# Patient Record
Sex: Male | Born: 1970 | Race: Black or African American | Hispanic: No | Marital: Married | Smoking: Former smoker
Health system: Southern US, Community
[De-identification: ages and names within clinical notes are randomized; demographics above are authoritative.]

## PROBLEM LIST (undated history)

## (undated) DIAGNOSIS — F329 Major depressive disorder, single episode, unspecified: Secondary | ICD-10-CM

## (undated) DIAGNOSIS — J45909 Unspecified asthma, uncomplicated: Secondary | ICD-10-CM

## (undated) DIAGNOSIS — F4310 Post-traumatic stress disorder, unspecified: Secondary | ICD-10-CM

## (undated) DIAGNOSIS — Z87828 Personal history of other (healed) physical injury and trauma: Secondary | ICD-10-CM

## (undated) DIAGNOSIS — J449 Chronic obstructive pulmonary disease, unspecified: Secondary | ICD-10-CM

## (undated) DIAGNOSIS — M1990 Unspecified osteoarthritis, unspecified site: Secondary | ICD-10-CM

## (undated) DIAGNOSIS — F32A Depression, unspecified: Secondary | ICD-10-CM

## (undated) HISTORY — DX: Unspecified asthma, uncomplicated: J45.909

## (undated) HISTORY — DX: Post-traumatic stress disorder, unspecified: F43.10

## (undated) HISTORY — DX: Personal history of other (healed) physical injury and trauma: Z87.828

---

## 1999-03-20 ENCOUNTER — Emergency Department (HOSPITAL_COMMUNITY): Admission: EM | Admit: 1999-03-20 | Discharge: 1999-03-20 | Payer: Self-pay | Admitting: Emergency Medicine

## 2000-09-18 ENCOUNTER — Encounter: Admission: RE | Admit: 2000-09-18 | Discharge: 2000-09-18 | Payer: Self-pay | Admitting: Cardiovascular Disease

## 2000-09-18 ENCOUNTER — Encounter: Payer: Self-pay | Admitting: Cardiovascular Disease

## 2003-07-16 ENCOUNTER — Emergency Department (HOSPITAL_COMMUNITY): Admission: EM | Admit: 2003-07-16 | Discharge: 2003-07-16 | Payer: Self-pay | Admitting: Emergency Medicine

## 2003-07-21 ENCOUNTER — Emergency Department (HOSPITAL_COMMUNITY): Admission: EM | Admit: 2003-07-21 | Discharge: 2003-07-21 | Payer: Self-pay | Admitting: Emergency Medicine

## 2011-02-08 ENCOUNTER — Emergency Department (HOSPITAL_COMMUNITY)
Admission: EM | Admit: 2011-02-08 | Discharge: 2011-02-08 | Disposition: A | Discharge status: Home / Self Care | Payer: BC Managed Care – PPO | Attending: Emergency Medicine | Admitting: Emergency Medicine

## 2011-02-08 DIAGNOSIS — J029 Acute pharyngitis, unspecified: Secondary | ICD-10-CM | POA: Insufficient documentation

## 2014-10-08 ENCOUNTER — Other Ambulatory Visit: Payer: Self-pay | Admitting: Orthopedic Surgery

## 2014-10-08 NOTE — Progress Notes (Signed)
Preoperative surgical orders have been place into the [facility] hospital system for Glenn Dean on 10/08/2014, 1:47 PM  by Patrica DuelPERKINS, ALEXZANDREW for surgery on 11/04/2014.  Preop Total Hip - Anterior Approach orders including Experel Injecion, IV Tylenol, and IV Decadron as long as there are no contraindications to the above medications. Avel Peacerew Perkins, PA-C

## 2014-10-26 NOTE — Patient Instructions (Addendum)
Glenn Dean  10/26/2014   Your procedure is scheduled on:  11/04/2014    Come thru the [facility] Entrance.    Follow the Signs to Short Stay Center at  1215pm  Call this number if you have problems the morning of surgery: [phone]   Remember:   Do not eat food after midnite.  May have clear liquids until 0730am then nothing by mouth.    Take these medicines the morning of surgery with A SIP OF WATER: Use albuterol Inhaler if needed and bring with you.    Do not wear jewelry,  Do not wear lotions, powders, or perfumes.deodorant.  . Men may shave face and neck.  Do not bring valuables to the hospital.  Contacts, dentures or bridgework may not be worn into surgery.  Leave suitcase in the car. After surgery it may be brought to your room.  For patients admitted to the hospital, checkout time is 11:00 AM the day of  discharge.        CLEAR LIQUID DIET   Foods Allowed                                                                     Foods Excluded  Coffee and tea, regular and decaf                             liquids that you cannot  Plain Jell-O in any flavor                                             see through such as: Fruit ices (not with fruit pulp)                                     milk, soups, orange juice  Iced Popsicles                                    All solid food Carbonated beverages, regular and diet                                    Cranberry, grape and apple juices Sports drinks like Gatorade Lightly seasoned clear broth or consume(fat free) Sugar, honey syrup  Sample Menu Breakfast                                Lunch                                     Supper Cranberry juice                    Beef broth  Chicken broth Jell-O                                     Grape juice                           Apple juice Coffee or tea                        Jell-O                                      Popsicle                                                 Coffee or tea                        Coffee or tea  _____________________________________________________________________  [facility] - Preparing for Surgery Before surgery, you can play an important role.  Because skin is not sterile, your skin needs to be as free of germs as possible.  You can reduce the number of germs on your skin by washing with CHG (chlorahexidine gluconate) soap before surgery.  CHG is an antiseptic cleaner which kills germs and bonds with the skin to continue killing germs even after washing. Please DO NOT use if you have an allergy to CHG or antibacterial soaps.  If your skin becomes reddened/irritated stop using the CHG and inform your nurse when you arrive at Short Stay. Do not shave (including legs and underarms) for at least 48 hours prior to the first CHG shower.  You may shave your face/neck. Please follow these instructions carefully:  1.  Shower with CHG Soap the night before surgery and the  morning of Surgery.  2.  If you choose to wash your hair, wash your hair first as usual with your  normal  shampoo.  3.  After you shampoo, rinse your hair and body thoroughly to remove the  shampoo.                           4.  Use CHG as you would any other liquid soap.  You can apply chg directly  to the skin and wash                       Gently with a scrungie or clean washcloth.  5.  Apply the CHG Soap to your body ONLY FROM THE NECK DOWN.   Do not use on face/ open                           Wound or open sores. Avoid contact with eyes, ears mouth and genitals (private parts).                       Wash face,  Genitals (private parts) with your normal soap.             6.  Wash thoroughly, paying special attention to the area  where your surgery  will be performed.  7.  Thoroughly rinse your body with warm water from the neck down.  8.  DO NOT shower/wash with your normal soap after using and rinsing off  the CHG Soap.                9.   Pat yourself dry with a clean towel.            10.  Wear clean pajamas.            11.  Place clean sheets on your bed the night of your first shower and do not  sleep with pets. Day of Surgery : Do not apply any lotions/deodorants the morning of surgery.  Please wear clean clothes to the hospital/surgery center.  FAILURE TO FOLLOW THESE INSTRUCTIONS MAY RESULT IN THE CANCELLATION OF YOUR SURGERY PATIENT SIGNATURE_________________________________  NURSE SIGNATURE__________________________________  ________________________________________________________________________  WHAT IS A BLOOD TRANSFUSION? Blood Transfusion Information  A transfusion is the replacement of blood or some of its parts. Blood is made up of multiple cells which provide different functions.  Red blood cells carry oxygen and are used for blood loss replacement.  White blood cells fight against infection.  Platelets control bleeding.  Plasma helps clot blood.  Other blood products are available for specialized needs, such as hemophilia or other clotting disorders. BEFORE THE TRANSFUSION  Who gives blood for transfusions?   Healthy volunteers who are fully evaluated to make sure their blood is safe. This is blood bank blood. Transfusion therapy is the safest it has ever been in the practice of medicine. Before blood is taken from a donor, a complete history is taken to make sure that person has no history of diseases nor engages in risky social behavior (examples are intravenous drug use or sexual activity with multiple partners). The donor's travel history is screened to minimize risk of transmitting infections, such as malaria. The donated blood is tested for signs of infectious diseases, such as HIV and hepatitis. The blood is then tested to be sure it is compatible with you in order to minimize the chance of a transfusion reaction. If you or a relative donates blood, this is often done in anticipation of surgery  and is not appropriate for emergency situations. It takes many days to process the donated blood. RISKS AND COMPLICATIONS Although transfusion therapy is very safe and saves many lives, the main dangers of transfusion include:  1. Getting an infectious disease. 2. Developing a transfusion reaction. This is an allergic reaction to something in the blood you were given. Every precaution is taken to prevent this. The decision to have a blood transfusion has been considered carefully by your caregiver before blood is given. Blood is not given unless the benefits outweigh the risks. AFTER THE TRANSFUSION  Right after receiving a blood transfusion, you will usually feel much better and more energetic. This is especially true if your red blood cells have gotten low (anemic). The transfusion raises the level of the red blood cells which carry oxygen, and this usually causes an energy increase.  The nurse administering the transfusion will monitor you carefully for complications. HOME CARE INSTRUCTIONS  No special instructions are needed after a transfusion. You may find your energy is better. Speak with your caregiver about any limitations on activity for underlying diseases you may have. SEEK MEDICAL CARE IF:   Your condition is not improving after your transfusion.  You develop redness or irritation at the intravenous (  IV) site. SEEK IMMEDIATE MEDICAL CARE IF:  Any of the following symptoms occur over the next 12 hours:  Shaking chills.  You have a temperature by mouth above 102 F (38.9 C), not controlled by medicine.  Chest, back, or muscle pain.  People around you feel you are not acting correctly or are confused.  Shortness of breath or difficulty breathing.  Dizziness and fainting.  You get a rash or develop hives.  You have a decrease in urine output.  Your urine turns a dark color or changes to pink, red, or brown. Any of the following symptoms occur over the next 10  days:  You have a temperature by mouth above 102 F (38.9 C), not controlled by medicine.  Shortness of breath.  Weakness after normal activity.  The white part of the eye turns yellow (jaundice).  You have a decrease in the amount of urine or are urinating less often.  Your urine turns a dark color or changes to pink, red, or brown. Document Released: 12/08/2000 Document Revised: 03/04/2012 Document Reviewed: 07/27/2008 Exit[place] Patient Information 2014 [place].  _______________________________________________________________________  Incentive Spirometer  An incentive spirometer is a tool that can help keep your lungs clear and active. This tool measures how well you are filling your lungs with each breath. Taking long deep breaths may help reverse or decrease the chance of developing breathing (pulmonary) problems (especially infection) following:  A long period of time when you are unable to move or be active. BEFORE THE PROCEDURE   If the spirometer includes an indicator to show your best effort, your nurse or respiratory therapist will set it to a desired goal.  If possible, sit up straight or lean slightly forward. Try not to slouch.  Hold the incentive spirometer in an upright position. INSTRUCTIONS FOR USE  3. Sit on the edge of your bed if possible, or sit up as far as you can in bed or on a chair. 4. Hold the incentive spirometer in an upright position. 5. Breathe out normally. 6. Place the mouthpiece in your mouth and seal your lips tightly around it. 7. Breathe in slowly and as deeply as possible, raising the piston or the ball toward the top of the column. 8. Hold your breath for 3-5 seconds or for as long as possible. Allow the piston or ball to fall to the bottom of the column. 9. Remove the mouthpiece from your mouth and breathe out normally. 10. Rest for a few seconds and repeat Steps 1 through 7 at least 10 times every 1-2 hours when you are awake.  Take your time and take a few normal breaths between deep breaths. 11. The spirometer may include an indicator to show your best effort. Use the indicator as a goal to work toward during each repetition. 12. After each set of 10 deep breaths, practice coughing to be sure your lungs are clear. If you have an incision (the cut made at the time of surgery), support your incision when coughing by placing a pillow or rolled up towels firmly against it. Once you are able to get out of bed, walk around indoors and cough well. You may stop using the incentive spirometer when instructed by your caregiver.  RISKS AND COMPLICATIONS  Take your time so you do not get dizzy or light-headed.  If you are in pain, you may need to take or ask for pain medication before doing incentive spirometry. It is harder to take a deep breath if you  are having pain. AFTER USE  Rest and breathe slowly and easily.  It can be helpful to keep track of a log of your progress. Your caregiver can provide you with a simple table to help with this. If you are using the spirometer at home, follow these instructions: [place] IF:   You are having difficultly using the spirometer.  You have trouble using the spirometer as often as instructed.  Your pain medication is not giving enough relief while using the spirometer.  You develop fever of 100.5 F (38.1 C) or higher. SEEK IMMEDIATE MEDICAL CARE IF:   You cough up bloody sputum that had not been present before.  You develop fever of 102 F (38.9 C) or greater.  You develop worsening pain at or near the incision site. MAKE SURE YOU:   Understand these instructions.  Will watch your condition.  Will get help right away if you are not doing well or get worse. Document Released: 04/23/2007 Document Revised: 03/04/2012 Document Reviewed: 06/24/2007 ExitCare Patient Information 2014 ExitCare,  [place].   ________________________________________________________________________    Please read over the following fact sheets that you were given: MRSA Information, coughing and deep breathing exercises, leg exercises

## 2014-10-27 ENCOUNTER — Encounter (HOSPITAL_COMMUNITY)
Admission: RE | Admit: 2014-10-27 | Discharge: 2014-10-27 | Disposition: A | Discharge status: Home / Self Care | Payer: BC Managed Care – PPO | Source: Ambulatory Visit | Attending: Orthopedic Surgery | Admitting: Orthopedic Surgery

## 2014-10-27 ENCOUNTER — Encounter (INDEPENDENT_AMBULATORY_CARE_PROVIDER_SITE_OTHER): Payer: Self-pay

## 2014-10-27 ENCOUNTER — Encounter (HOSPITAL_COMMUNITY): Payer: Self-pay

## 2014-10-27 ENCOUNTER — Ambulatory Visit (HOSPITAL_COMMUNITY)
Admission: RE | Admit: 2014-10-27 | Discharge: 2014-10-27 | Disposition: A | Discharge status: Home / Self Care | Payer: BC Managed Care – PPO | Source: Ambulatory Visit | Attending: Orthopedic Surgery | Admitting: Orthopedic Surgery

## 2014-10-27 DIAGNOSIS — Z01811 Encounter for preprocedural respiratory examination: Secondary | ICD-10-CM | POA: Diagnosis not present

## 2014-10-27 DIAGNOSIS — Z01812 Encounter for preprocedural laboratory examination: Secondary | ICD-10-CM | POA: Insufficient documentation

## 2014-10-27 HISTORY — DX: Major depressive disorder, single episode, unspecified: F32.9

## 2014-10-27 HISTORY — DX: Depression, unspecified: F32.A

## 2014-10-27 HISTORY — DX: Unspecified osteoarthritis, unspecified site: M19.90

## 2014-10-27 LAB — PROTIME-INR
INR: 1.05 (ref 0.00–1.49)
PROTHROMBIN TIME: 13.8 s (ref 11.6–15.2)

## 2014-10-27 LAB — URINALYSIS, ROUTINE W REFLEX MICROSCOPIC
Bilirubin Urine: NEGATIVE
Glucose, UA: NEGATIVE mg/dL
Hgb urine dipstick: NEGATIVE
KETONES UR: NEGATIVE mg/dL
Leukocytes, UA: NEGATIVE
NITRITE: NEGATIVE
Protein, ur: NEGATIVE mg/dL
Specific Gravity, Urine: 1.008 (ref 1.005–1.030)
UROBILINOGEN UA: 0.2 mg/dL (ref 0.0–1.0)
pH: 7 (ref 5.0–8.0)

## 2014-10-27 LAB — SURGICAL PCR SCREEN
MRSA, PCR: NEGATIVE
Staphylococcus aureus: NEGATIVE

## 2014-10-27 LAB — COMPREHENSIVE METABOLIC PANEL
ALT: 20 U/L (ref 0–53)
ANION GAP: 9 (ref 5–15)
AST: 18 U/L (ref 0–37)
Albumin: 3.9 g/dL (ref 3.5–5.2)
Alkaline Phosphatase: 55 U/L (ref 39–117)
BUN: 8 mg/dL (ref 6–23)
CALCIUM: 9.7 mg/dL (ref 8.4–10.5)
CO2: 29 meq/L (ref 19–32)
CREATININE: 1.02 mg/dL (ref 0.50–1.35)
Chloride: 103 mEq/L (ref 96–112)
GFR, EST NON AFRICAN AMERICAN: 87 mL/min — AB (ref >90)
GLUCOSE: 93 mg/dL (ref 70–99)
Potassium: 4.1 mEq/L (ref 3.7–5.3)
Sodium: 141 mEq/L (ref 137–147)
Total Bilirubin: 0.4 mg/dL (ref 0.3–1.2)
Total Protein: 7.9 g/dL (ref 6.0–8.3)

## 2014-10-27 LAB — CBC
HEMATOCRIT: 41.2 % (ref 39.0–52.0)
Hemoglobin: 14.2 g/dL (ref 13.0–17.0)
MCH: 31.8 pg (ref 26.0–34.0)
MCHC: 34.5 g/dL (ref 30.0–36.0)
MCV: 92.2 fL (ref 78.0–100.0)
Platelets: 219 10*3/uL (ref 150–400)
RBC: 4.47 MIL/uL (ref 4.22–5.81)
RDW: 12.9 % (ref 11.5–15.5)
WBC: 3.5 10*3/uL — ABNORMAL LOW (ref 4.0–10.5)

## 2014-10-27 LAB — APTT: aPTT: 29 seconds (ref 24–37)

## 2014-10-27 NOTE — Progress Notes (Signed)
Resent to patient email EMMI education

## 2014-11-03 ENCOUNTER — Ambulatory Visit: Payer: Self-pay | Admitting: Orthopedic Surgery

## 2014-11-03 NOTE — H&P (Signed)
Glenn GoodyHarold J. Dean DOB: 05/13/1971 Married / Language: [place] / Race: Black or African American, White Male Date of Admission:  11/04/2014 Chief Complaint:  Left Hip Pain History of Present Illness The patient is a 43 year old male who comes in  for a preoperative History and Physical. The patient is scheduled for a left total hip arthroplasty (anterior approach) to be performed by Dr. Gus RankinFrank V. [facility]uisio[facility]. The patient is a 43 year old male who presents for follow up of their hip. The patient is being followed for their left hip pain and osteoarthritis. Symptoms reported today include: pain, aching, throbbing and pain with weightbearing. The patient feels that they are doing poorly and report their pain level to be 5 / 10. Current treatment includes: NSAIDs (Etodolac). The following medication has been used for pain control: antiinflammatory medication. Glenn Dean has had problems with his hip for many years now. It is getting progressively worse over time. It is hurting him all the time. It is affecting his ability to do things and is affecting his ability to work. The pain bothers him at night and keeps him awake. He at this point is not getting better even with an injection. The most predictable means of improving his pain and function is going to be a total hip arthroplasty. We did discuss that in detail. We will set him up for surgical treatment. He is an excellent candidate for an anterior approach. They have been treated conservatively in the past for the above stated problem and despite conservative measures, they continue to have progressive pain and severe functional limitations and dysfunction. They have failed non-operative management including home exercise, medications, and injections. It is felt that they would benefit from undergoing total joint replacement. Risks and benefits of the procedure have been discussed with the patient and they elect to proceed  with surgery. There are no active contraindications to surgery such as ongoing infection or rapidly progressive neurological disease.  Problem List/Past Medical Primary osteoarthritis of left hip (M16.12) Depression  Allergies Tylenol with Codeine #3 *ANALGESICS - OPIOID* Hydrocodone-Acetaminophen *ANALGESICS - OPIOID*  Family History  Depression Mother. Diabetes Mellitus Father, Maternal Grandmother, Mother. Congestive Heart Failure Maternal Grandmother. Cerebrovascular Accident Maternal Grandmother.  Social History  Number of flights of stairs before winded 2-3 Tobacco / smoke exposure 04/22/2014: yes Not under pain contract Marital status married No history of drug/alcohol rehab Tobacco use Current every day smoker. 04/22/2014: smoke(d) less than 1/2 pack(s) per day Exercise Exercises rarely; does gym / weights Living situation live with spouse Current work status working full time Children 3 Current drinker 04/22/2014: Currently drinks beer less than 5 times per week  Medication History Etodolac (Oral) Specific dose unknown - Active. Sertraline HCl (Oral) Specific dose unknown - Active.  Past Surgical History No pertinent past surgical history  Review of Systems General Not Present- Chills, Fatigue, Fever, Memory Loss, Night Sweats, Weight Gain and Weight Loss. Skin Not Present- Eczema, Hives, Itching, Lesions and Rash. HEENT Not Present- Dentures, Double Vision, Headache, Hearing Loss, Tinnitus and Visual Loss. Respiratory Not Present- Allergies, Chronic Cough, Coughing up blood, Shortness of breath at rest and Shortness of breath with exertion. Cardiovascular Not Present- Chest Pain, Difficulty Breathing Lying Down, Murmur, Palpitations, Racing/skipping heartbeats and Swelling. Gastrointestinal Not Present- Abdominal Pain, Bloody Stool, Constipation, Diarrhea, Difficulty Swallowing, Heartburn, Jaundice, Loss of appetitie, Nausea and Vomiting. Male  Genitourinary Not Present- Blood in Urine, Discharge, Flank Pain, Incontinence, Painful Urination, Urgency, Urinary  frequency, Urinary Retention, Urinating at Night and Weak urinary stream. Musculoskeletal Present- Joint Pain (left hip pain). Not Present- Back Pain, Joint Swelling, Morning Stiffness, Muscle Pain, Muscle Weakness and Spasms. Neurological Not Present- Blackout spells, Difficulty with balance, Dizziness, Paralysis, Tremor and Weakness. Psychiatric Not Present- Insomnia.  Vitals  Weight: 145 lb Height: 69in Weight was reported by patient. Height was reported by patient. Body Surface Area: 1.79 m Body Mass Index: 21.41 kg/m  Pulse: 68 (Regular)  BP: 108/68 (Sitting, Left Arm, Standard)   Physical Exam General Mental Status -Alert, cooperative and good historian. General Appearance-pleasant, Not in acute distress. Orientation-Oriented X3. Build & Nutrition-Lean, Well nourished and Well developed.  Head and Neck Head-normocephalic, atraumatic . Neck Global Assessment - supple, no bruit auscultated on the right, no bruit auscultated on the left.  Eye Pupil - Bilateral-Regular and Round. Motion - Bilateral-EOMI.  Chest and Lung Exam Auscultation Breath sounds - clear at anterior chest wall and clear at posterior chest wall. Adventitious sounds - No Adventitious sounds.  Cardiovascular Auscultation Rhythm - Regular rate and rhythm. Heart Sounds - S1 WNL and S2 WNL. Murmurs & Other Heart Sounds - Auscultation of the heart reveals - No Murmurs.  Abdomen Palpation/Percussion Tenderness - Abdomen is non-tender to palpation. Rigidity (guarding) - Abdomen is soft. Auscultation Auscultation of the abdomen reveals - Bowel sounds normal.  Male Genitourinary Note: Not done, not pertinent to present illness   Musculoskeletal Note: He is alert and oriented. No apparent distress. The right hip has a normal range of motion and no discomfort. Left  hip flexion to 100, no internal rotation. There is about 10 external rotation and 10 abduction.  RADIOGRAPHS: His radiographs are reviewed. AP of the pelvis and lateral of the left hip shows bone on bone arthritis in the left hip.   Assessment & Plan  Primary osteoarthritis of left hip (M16.12) Note:Plan is for a Left Total Hip Replacement - anterior approach by Dr. Aluisio.  Plan is to go home with wife and family.  PCP - Dr. Samuel Kelley  The patient does not have any contraindications and will receive TXA (tranexamic acid) prior to surgery.  Signed electronically by Christabell Loseke L Ranveer Wahlstrom, III PA-C 

## 2014-11-04 ENCOUNTER — Inpatient Hospital Stay (HOSPITAL_COMMUNITY): Payer: BC Managed Care – PPO

## 2014-11-04 ENCOUNTER — Inpatient Hospital Stay (HOSPITAL_COMMUNITY)
Admission: RE | Admit: 2014-11-04 | Discharge: 2014-11-06 | DRG: 470 | Disposition: A | Discharge status: Home Health | Payer: BC Managed Care – PPO | Source: Ambulatory Visit | Attending: Orthopedic Surgery | Admitting: Orthopedic Surgery

## 2014-11-04 ENCOUNTER — Encounter (HOSPITAL_COMMUNITY): Payer: Self-pay | Admitting: *Deleted

## 2014-11-04 ENCOUNTER — Inpatient Hospital Stay (HOSPITAL_COMMUNITY): Payer: BC Managed Care – PPO | Admitting: Certified Registered Nurse Anesthetist

## 2014-11-04 ENCOUNTER — Encounter (HOSPITAL_COMMUNITY)
Admission: RE | Disposition: A | Discharge status: Home Health | Payer: Self-pay | Source: Ambulatory Visit | Attending: Orthopedic Surgery

## 2014-11-04 DIAGNOSIS — F329 Major depressive disorder, single episode, unspecified: Secondary | ICD-10-CM | POA: Diagnosis present

## 2014-11-04 DIAGNOSIS — Z96649 Presence of unspecified artificial hip joint: Secondary | ICD-10-CM

## 2014-11-04 DIAGNOSIS — F17210 Nicotine dependence, cigarettes, uncomplicated: Secondary | ICD-10-CM | POA: Diagnosis present

## 2014-11-04 DIAGNOSIS — M1612 Unilateral primary osteoarthritis, left hip: Principal | ICD-10-CM | POA: Diagnosis present

## 2014-11-04 DIAGNOSIS — M25552 Pain in left hip: Secondary | ICD-10-CM | POA: Diagnosis present

## 2014-11-04 HISTORY — PX: TOTAL HIP ARTHROPLASTY: SHX124

## 2014-11-04 LAB — TYPE AND SCREEN
ABO/RH(D): O POS
Antibody Screen: NEGATIVE

## 2014-11-04 LAB — ABO/RH: ABO/RH(D): O POS

## 2014-11-04 SURGERY — ARTHROPLASTY, HIP, TOTAL, ANTERIOR APPROACH
Anesthesia: General | Site: Hip | Laterality: Left

## 2014-11-04 MED ORDER — PROPOFOL 10 MG/ML IV BOLUS
INTRAVENOUS | Status: AC
  Filled 2014-11-04: qty 20

## 2014-11-04 MED ORDER — ACETAMINOPHEN 325 MG PO TABS: 650.0000 mg | ORAL_TABLET | Freq: Four times a day (QID) | ORAL | Status: DC | PRN

## 2014-11-04 MED ORDER — ALBUTEROL SULFATE (2.5 MG/3ML) 0.083% IN NEBU: 2.5000 mg | INHALATION_SOLUTION | Freq: Four times a day (QID) | RESPIRATORY_TRACT | Status: DC | PRN

## 2014-11-04 MED ORDER — SODIUM CHLORIDE 0.9 % IJ SOLN
INTRAMUSCULAR | Status: AC
  Filled 2014-11-04: qty 50

## 2014-11-04 MED ORDER — DEXAMETHASONE SODIUM PHOSPHATE 10 MG/ML IJ SOLN
10.0000 mg | Freq: Once | INTRAMUSCULAR | Status: DC
  Filled 2014-11-04: qty 1

## 2014-11-04 MED ORDER — HYDROMORPHONE HCL 2 MG PO TABS
2.0000 mg | ORAL_TABLET | ORAL | Status: DC | PRN
  Administered 2014-11-04: 2 mg via ORAL
  Administered 2014-11-05 – 2014-11-06 (×6): 4 mg via ORAL
  Filled 2014-11-04 (×4): qty 2
  Filled 2014-11-04: qty 1
  Filled 2014-11-04 (×2): qty 2

## 2014-11-04 MED ORDER — ACETAMINOPHEN 500 MG PO TABS
1000.0000 mg | ORAL_TABLET | Freq: Four times a day (QID) | ORAL | Status: AC
Start: 2014-11-04 — End: 2014-11-05
  Administered 2014-11-04 – 2014-11-05 (×2): 1000 mg via ORAL
  Filled 2014-11-04 (×3): qty 2

## 2014-11-04 MED ORDER — GLYCOPYRROLATE 0.2 MG/ML IJ SOLN
INTRAMUSCULAR | Status: AC
  Filled 2014-11-04: qty 2

## 2014-11-04 MED ORDER — HYDROMORPHONE HCL 1 MG/ML IJ SOLN
0.5000 mg | INTRAMUSCULAR | Status: DC | PRN
  Administered 2014-11-05: 1 mg via INTRAVENOUS
  Filled 2014-11-04: qty 1

## 2014-11-04 MED ORDER — CEFAZOLIN SODIUM-DEXTROSE 2-3 GM-% IV SOLR
2.0000 g | Freq: Four times a day (QID) | INTRAVENOUS | Status: AC
  Administered 2014-11-04 – 2014-11-05 (×2): 2 g via INTRAVENOUS
  Filled 2014-11-04 (×2): qty 50

## 2014-11-04 MED ORDER — RIVAROXABAN 10 MG PO TABS
10.0000 mg | ORAL_TABLET | Freq: Every day | ORAL | Status: DC
  Administered 2014-11-05 – 2014-11-06 (×2): 10 mg via ORAL
  Filled 2014-11-04 (×3): qty 1

## 2014-11-04 MED ORDER — ONDANSETRON HCL 4 MG/2ML IJ SOLN
INTRAMUSCULAR | Status: AC
  Filled 2014-11-04: qty 2

## 2014-11-04 MED ORDER — MIDAZOLAM HCL 5 MG/5ML IJ SOLN
INTRAMUSCULAR | Status: DC | PRN
Start: 2014-11-04 — End: 2014-11-04
  Administered 2014-11-04: 2 mg via INTRAVENOUS

## 2014-11-04 MED ORDER — METOCLOPRAMIDE HCL 10 MG PO TABS: 5.0000 mg | ORAL_TABLET | Freq: Three times a day (TID) | ORAL | Status: DC | PRN

## 2014-11-04 MED ORDER — LACTATED RINGERS IV SOLN: INTRAVENOUS | Status: DC

## 2014-11-04 MED ORDER — METHOCARBAMOL 500 MG PO TABS
500.0000 mg | ORAL_TABLET | Freq: Four times a day (QID) | ORAL | Status: DC | PRN
  Administered 2014-11-05 – 2014-11-06 (×3): 500 mg via ORAL
  Filled 2014-11-04 (×3): qty 1

## 2014-11-04 MED ORDER — ONDANSETRON HCL 4 MG/2ML IJ SOLN: 4.0000 mg | Freq: Four times a day (QID) | INTRAMUSCULAR | Status: DC | PRN

## 2014-11-04 MED ORDER — ALBUTEROL SULFATE HFA 108 (90 BASE) MCG/ACT IN AERS
INHALATION_SPRAY | RESPIRATORY_TRACT | Status: DC | PRN
  Administered 2014-11-04: 5 via RESPIRATORY_TRACT

## 2014-11-04 MED ORDER — PHENYLEPHRINE 40 MCG/ML (10ML) SYRINGE FOR IV PUSH (FOR BLOOD PRESSURE SUPPORT)
PREFILLED_SYRINGE | INTRAVENOUS | Status: AC
  Filled 2014-11-04: qty 10

## 2014-11-04 MED ORDER — GLYCOPYRROLATE 0.2 MG/ML IJ SOLN
INTRAMUSCULAR | Status: DC | PRN
  Administered 2014-11-04: .5 mg via INTRAVENOUS

## 2014-11-04 MED ORDER — DEXAMETHASONE SODIUM PHOSPHATE 10 MG/ML IJ SOLN
INTRAMUSCULAR | Status: AC
  Filled 2014-11-04: qty 1

## 2014-11-04 MED ORDER — POLYETHYLENE GLYCOL 3350 17 G PO PACK: 17.0000 g | PACK | Freq: Every day | ORAL | Status: DC | PRN

## 2014-11-04 MED ORDER — ROCURONIUM BROMIDE 100 MG/10ML IV SOLN
INTRAVENOUS | Status: AC
  Filled 2014-11-04: qty 1

## 2014-11-04 MED ORDER — FENTANYL CITRATE 0.05 MG/ML IJ SOLN
INTRAMUSCULAR | Status: AC
  Filled 2014-11-04: qty 2

## 2014-11-04 MED ORDER — ROCURONIUM BROMIDE 100 MG/10ML IV SOLN
INTRAVENOUS | Status: DC | PRN
  Administered 2014-11-04: 50 mg via INTRAVENOUS

## 2014-11-04 MED ORDER — BISACODYL 10 MG RE SUPP: 10.0000 mg | Freq: Every day | RECTAL | Status: DC | PRN

## 2014-11-04 MED ORDER — BUPIVACAINE HCL (PF) 0.25 % IJ SOLN
INTRAMUSCULAR | Status: DC | PRN
  Administered 2014-11-04: 20 mL

## 2014-11-04 MED ORDER — CEFAZOLIN SODIUM-DEXTROSE 2-3 GM-% IV SOLR
2.0000 g | INTRAVENOUS | Status: AC
  Administered 2014-11-04: 2 g via INTRAVENOUS

## 2014-11-04 MED ORDER — HYDROMORPHONE HCL 1 MG/ML IJ SOLN
INTRAMUSCULAR | Status: AC
  Filled 2014-11-04: qty 1

## 2014-11-04 MED ORDER — LIDOCAINE HCL (CARDIAC) 20 MG/ML IV SOLN
INTRAVENOUS | Status: AC
  Filled 2014-11-04: qty 5

## 2014-11-04 MED ORDER — HYDROMORPHONE HCL 1 MG/ML IJ SOLN
0.2500 mg | INTRAMUSCULAR | Status: DC | PRN
  Administered 2014-11-04 (×4): 0.5 mg via INTRAVENOUS

## 2014-11-04 MED ORDER — DOCUSATE SODIUM 100 MG PO CAPS
100.0000 mg | ORAL_CAPSULE | Freq: Two times a day (BID) | ORAL | Status: DC
  Administered 2014-11-04 – 2014-11-06 (×4): 100 mg via ORAL

## 2014-11-04 MED ORDER — LACTATED RINGERS IV SOLN
INTRAVENOUS | Status: DC
  Administered 2014-11-04: 1000 mL via INTRAVENOUS

## 2014-11-04 MED ORDER — SODIUM CHLORIDE 0.9 % IV SOLN
1000.0000 mg | INTRAVENOUS | Status: AC
  Administered 2014-11-04: 1000 mg via INTRAVENOUS
  Filled 2014-11-04: qty 10

## 2014-11-04 MED ORDER — NEOSTIGMINE METHYLSULFATE 10 MG/10ML IV SOLN
INTRAVENOUS | Status: DC | PRN
Start: 2014-11-04 — End: 2014-11-04
  Administered 2014-11-04: 3.5 mg via INTRAVENOUS

## 2014-11-04 MED ORDER — NEOSTIGMINE METHYLSULFATE 10 MG/10ML IV SOLN
INTRAVENOUS | Status: AC
  Filled 2014-11-04: qty 1

## 2014-11-04 MED ORDER — ACETAMINOPHEN 650 MG RE SUPP: 650.0000 mg | Freq: Four times a day (QID) | RECTAL | Status: DC | PRN

## 2014-11-04 MED ORDER — DEXAMETHASONE SODIUM PHOSPHATE 10 MG/ML IJ SOLN
INTRAMUSCULAR | Status: DC | PRN
  Administered 2014-11-04: 10 mg via INTRAVENOUS

## 2014-11-04 MED ORDER — FLEET ENEMA 7-19 GM/118ML RE ENEM: 1.0000 | ENEMA | Freq: Once | RECTAL | Status: AC | PRN

## 2014-11-04 MED ORDER — BUPIVACAINE LIPOSOME 1.3 % IJ SUSP
20.0000 mL | Freq: Once | INTRAMUSCULAR | Status: DC
  Filled 2014-11-04: qty 20

## 2014-11-04 MED ORDER — FENTANYL CITRATE 0.05 MG/ML IJ SOLN
INTRAMUSCULAR | Status: AC
  Filled 2014-11-04: qty 5

## 2014-11-04 MED ORDER — METOCLOPRAMIDE HCL 5 MG/ML IJ SOLN
5.0000 mg | Freq: Three times a day (TID) | INTRAMUSCULAR | Status: DC | PRN
  Administered 2014-11-05: 10 mg via INTRAVENOUS
  Filled 2014-11-04: qty 2

## 2014-11-04 MED ORDER — ONDANSETRON HCL 4 MG PO TABS: 4.0000 mg | ORAL_TABLET | Freq: Four times a day (QID) | ORAL | Status: DC | PRN

## 2014-11-04 MED ORDER — PROPOFOL 10 MG/ML IV BOLUS
INTRAVENOUS | Status: DC | PRN
  Administered 2014-11-04: 200 mg via INTRAVENOUS

## 2014-11-04 MED ORDER — ACETAMINOPHEN 10 MG/ML IV SOLN
1000.0000 mg | Freq: Once | INTRAVENOUS | Status: AC
  Administered 2014-11-04: 1000 mg via INTRAVENOUS
  Filled 2014-11-04: qty 100

## 2014-11-04 MED ORDER — CEFAZOLIN SODIUM-DEXTROSE 2-3 GM-% IV SOLR
INTRAVENOUS | Status: AC
  Filled 2014-11-04: qty 50

## 2014-11-04 MED ORDER — EPHEDRINE SULFATE 50 MG/ML IJ SOLN
INTRAMUSCULAR | Status: DC | PRN
  Administered 2014-11-04: 5 mg via INTRAVENOUS

## 2014-11-04 MED ORDER — HYDROMORPHONE HCL 1 MG/ML IJ SOLN
INTRAMUSCULAR | Status: DC | PRN
  Administered 2014-11-04: 0.5 mg via INTRAVENOUS
  Administered 2014-11-04: 1 mg via INTRAVENOUS
  Administered 2014-11-04: 0.5 mg via INTRAVENOUS

## 2014-11-04 MED ORDER — FENTANYL CITRATE 0.05 MG/ML IJ SOLN
INTRAMUSCULAR | Status: DC | PRN
  Administered 2014-11-04: 100 ug via INTRAVENOUS
  Administered 2014-11-04 (×5): 50 ug via INTRAVENOUS

## 2014-11-04 MED ORDER — GLYCOPYRROLATE 0.2 MG/ML IJ SOLN
INTRAMUSCULAR | Status: AC
  Filled 2014-11-04: qty 3

## 2014-11-04 MED ORDER — BUPIVACAINE HCL (PF) 0.25 % IJ SOLN
INTRAMUSCULAR | Status: AC
  Filled 2014-11-04: qty 30

## 2014-11-04 MED ORDER — SODIUM CHLORIDE 0.9 % IV SOLN: INTRAVENOUS | Status: DC

## 2014-11-04 MED ORDER — CHLORHEXIDINE GLUCONATE 4 % EX LIQD: 60.0000 mL | Freq: Once | CUTANEOUS | Status: DC

## 2014-11-04 MED ORDER — SODIUM CHLORIDE 0.9 % IJ SOLN
INTRAMUSCULAR | Status: DC | PRN
  Administered 2014-11-04: 30 mL

## 2014-11-04 MED ORDER — DEXTROSE 5 % IV SOLN
500.0000 mg | Freq: Four times a day (QID) | INTRAVENOUS | Status: DC | PRN
  Administered 2014-11-04: 500 mg via INTRAVENOUS
  Filled 2014-11-04 (×2): qty 5

## 2014-11-04 MED ORDER — ALBUTEROL SULFATE HFA 108 (90 BASE) MCG/ACT IN AERS
INHALATION_SPRAY | RESPIRATORY_TRACT | Status: AC
  Filled 2014-11-04: qty 6.7

## 2014-11-04 MED ORDER — ONDANSETRON HCL 4 MG/2ML IJ SOLN
INTRAMUSCULAR | Status: DC | PRN
Start: 2014-11-04 — End: 2014-11-04
  Administered 2014-11-04: 4 mg via INTRAVENOUS

## 2014-11-04 MED ORDER — DEXAMETHASONE SODIUM PHOSPHATE 10 MG/ML IJ SOLN: 10.0000 mg | Freq: Once | INTRAMUSCULAR | Status: DC

## 2014-11-04 MED ORDER — BUPIVACAINE LIPOSOME 1.3 % IJ SUSP
INTRAMUSCULAR | Status: DC | PRN
  Administered 2014-11-04: 20 mL

## 2014-11-04 MED ORDER — PHENYLEPHRINE HCL 10 MG/ML IJ SOLN
INTRAMUSCULAR | Status: DC | PRN
  Administered 2014-11-04: 40 ug via INTRAVENOUS
  Administered 2014-11-04: 80 ug via INTRAVENOUS
  Administered 2014-11-04: 40 ug via INTRAVENOUS

## 2014-11-04 MED ORDER — DIPHENHYDRAMINE HCL 12.5 MG/5ML PO ELIX: 12.5000 mg | ORAL_SOLUTION | ORAL | Status: DC | PRN

## 2014-11-04 MED ORDER — SUCCINYLCHOLINE CHLORIDE 20 MG/ML IJ SOLN
INTRAMUSCULAR | Status: DC | PRN
  Administered 2014-11-04: 100 mg via INTRAVENOUS

## 2014-11-04 MED ORDER — 0.9 % SODIUM CHLORIDE (POUR BTL) OPTIME
TOPICAL | Status: DC | PRN
  Administered 2014-11-04: 1000 mL

## 2014-11-04 MED ORDER — HYDROMORPHONE HCL 2 MG/ML IJ SOLN
INTRAMUSCULAR | Status: AC
  Filled 2014-11-04: qty 1

## 2014-11-04 MED ORDER — PHENOL 1.4 % MT LIQD: 1.0000 | OROMUCOSAL | Status: DC | PRN

## 2014-11-04 MED ORDER — SERTRALINE HCL 50 MG PO TABS
50.0000 mg | ORAL_TABLET | Freq: Every day | ORAL | Status: DC
  Administered 2014-11-04 – 2014-11-05 (×2): 50 mg via ORAL
  Filled 2014-11-04 (×3): qty 1

## 2014-11-04 MED ORDER — MIDAZOLAM HCL 2 MG/2ML IJ SOLN
INTRAMUSCULAR | Status: AC
  Filled 2014-11-04: qty 2

## 2014-11-04 MED ORDER — LIDOCAINE HCL (CARDIAC) 20 MG/ML IV SOLN
INTRAVENOUS | Status: DC | PRN
  Administered 2014-11-04: 80 mg via INTRAVENOUS

## 2014-11-04 MED ORDER — MENTHOL 3 MG MT LOZG
1.0000 | LOZENGE | OROMUCOSAL | Status: DC | PRN
  Filled 2014-11-04: qty 9

## 2014-11-04 SURGICAL SUPPLY — 39 items
BAG SPEC THK2 15X12 ZIP CLS (MISCELLANEOUS)
BAG ZIPLOCK 12X15 (MISCELLANEOUS) IMPLANT
BLADE EXTENDED COATED 6.5IN (ELECTRODE) ×2 IMPLANT
BLADE SAG 18X100X1.27 (BLADE) ×2 IMPLANT
CAPT HIP PF COP ×1 IMPLANT
COVER PERINEAL POST (MISCELLANEOUS) ×2 IMPLANT
DECANTER SPIKE VIAL GLASS SM (MISCELLANEOUS) ×2 IMPLANT
DRAPE C-ARM 42X120 X-RAY (DRAPES) ×2 IMPLANT
DRAPE STERI IOBAN 125X83 (DRAPES) ×2 IMPLANT
DRAPE U-SHAPE 47X51 STRL (DRAPES) ×6 IMPLANT
DRSG ADAPTIC 3X8 NADH LF (GAUZE/BANDAGES/DRESSINGS) ×2 IMPLANT
DRSG MEPILEX BORDER 4X4 (GAUZE/BANDAGES/DRESSINGS) ×2 IMPLANT
DRSG MEPILEX BORDER 4X8 (GAUZE/BANDAGES/DRESSINGS) ×2 IMPLANT
DURAPREP 26ML APPLICATOR (WOUND CARE) ×2 IMPLANT
ELECT REM PT RETURN 9FT ADLT (ELECTROSURGICAL) ×2
ELECTRODE REM PT RTRN 9FT ADLT (ELECTROSURGICAL) ×1 IMPLANT
EVACUATOR 1/8 PVC DRAIN (DRAIN) ×2 IMPLANT
FACESHIELD WRAPAROUND (MASK) ×8 IMPLANT
FACESHIELD WRAPAROUND OR TEAM (MASK) ×4 IMPLANT
GLOVE BIO SURGEON STRL SZ7.5 (GLOVE) ×2 IMPLANT
GLOVE BIO SURGEON STRL SZ8 (GLOVE) ×4 IMPLANT
GLOVE BIOGEL PI IND STRL 8 (GLOVE) ×2 IMPLANT
GLOVE BIOGEL PI INDICATOR 8 (GLOVE) ×2
GOWN STRL REUS W/TWL LRG LVL3 (GOWN DISPOSABLE) ×2 IMPLANT
GOWN STRL REUS W/TWL XL LVL3 (GOWN DISPOSABLE) ×2 IMPLANT
KIT BASIN OR (CUSTOM PROCEDURE TRAY) ×2 IMPLANT
NDL SAFETY ECLIPSE 18X1.5 (NEEDLE) ×2 IMPLANT
NEEDLE HYPO 18GX1.5 SHARP (NEEDLE) ×4
PACK TOTAL JOINT (CUSTOM PROCEDURE TRAY) ×2 IMPLANT
STRIP CLOSURE SKIN 1/2X4 (GAUZE/BANDAGES/DRESSINGS) ×2 IMPLANT
SUT ETHIBOND NAB CT1 #1 30IN (SUTURE) ×2 IMPLANT
SUT MNCRL AB 4-0 PS2 18 (SUTURE) ×2 IMPLANT
SUT VIC AB 2-0 CT1 27 (SUTURE) ×4
SUT VIC AB 2-0 CT1 TAPERPNT 27 (SUTURE) ×2 IMPLANT
SUT VLOC 180 0 24IN GS25 (SUTURE) ×2 IMPLANT
SYR 20CC LL (SYRINGE) ×2 IMPLANT
SYR 50ML LL SCALE MARK (SYRINGE) ×2 IMPLANT
TOWEL OR 17X26 10 PK STRL BLUE (TOWEL DISPOSABLE) ×2 IMPLANT
TRAY FOLEY CATH 14FRSI W/METER (CATHETERS) ×2 IMPLANT

## 2014-11-04 NOTE — Transfer of Care (Signed)
Immediate Anesthesia Transfer of Care Note  Patient: Glenn GoodyHarold J Tworek  Procedure(s) Performed: Procedure(s): LEFT TOTAL HIP ARTHROPLASTY ANTERIOR APPROACH (Left)  Patient Location: PACU  Anesthesia Type:General  Level of Consciousness: awake, alert , oriented and patient cooperative  Airway & Oxygen Therapy: Patient Spontanous Breathing and Patient connected to face mask oxygen  Post-op Assessment: Report given to PACU RN, Post -op Vital signs reviewed and stable and Patient moving all extremities  Post vital signs: Reviewed and stable  Complications: No apparent anesthesia complications

## 2014-11-04 NOTE — Plan of Care (Signed)
Problem: Phase I Progression Outcomes Goal: Hemodynamically stable Outcome: Completed/Met Date Met:  11/04/14

## 2014-11-04 NOTE — Plan of Care (Signed)
Problem: Phase I Progression Outcomes Goal: Pain controlled with appropriate interventions Outcome: Completed/Met Date Met:  11/04/14     

## 2014-11-04 NOTE — Plan of Care (Signed)
Problem: Phase I Progression Outcomes Goal: CMS/Neurovascular status WDL Outcome: Completed/Met Date Met:  11/04/14

## 2014-11-04 NOTE — H&P (View-Only) (Signed)
Glenn GoodyHarold J. Dean DOB: 05/13/1971 Married / Language: [place] / Race: Black or African American, White Male Date of Admission:  11/04/2014 Chief Complaint:  Left Hip Pain History of Present Illness The patient is a 43 year old male who comes in  for a preoperative History and Physical. The patient is scheduled for a left total hip arthroplasty (anterior approach) to be performed by Dr. Gus RankinFrank V. Aluisio, [facility] 11/04/2014. The patient is a 43 year old male who presents for follow up of their hip. The patient is being followed for their left hip pain and osteoarthritis. Symptoms reported today include: pain, aching, throbbing and pain with weightbearing. The patient feels that they are doing poorly and report their pain level to be 5 / 10. Current treatment includes: NSAIDs (Etodolac). The following medication has been used for pain control: antiinflammatory medication. Glenn SharkHarold has had problems with his hip for many years now. It is getting progressively worse over time. It is hurting him all the time. It is affecting his ability to do things and is affecting his ability to work. The pain bothers him at night and keeps him awake. He at this point is not getting better even with an injection. The most predictable means of improving his pain and function is going to be a total hip arthroplasty. We did discuss that in detail. We will set him up for surgical treatment. He is an excellent candidate for an anterior approach. They have been treated conservatively in the past for the above stated problem and despite conservative measures, they continue to have progressive pain and severe functional limitations and dysfunction. They have failed non-operative management including home exercise, medications, and injections. It is felt that they would benefit from undergoing total joint replacement. Risks and benefits of the procedure have been discussed with the patient and they elect to proceed  with surgery. There are no active contraindications to surgery such as ongoing infection or rapidly progressive neurological disease.  Problem List/Past Medical Primary osteoarthritis of left hip (M16.12) Depression  Allergies Tylenol with Codeine #3 *ANALGESICS - OPIOID* Hydrocodone-Acetaminophen *ANALGESICS - OPIOID*  Family History  Depression Mother. Diabetes Mellitus Father, Maternal Grandmother, Mother. Congestive Heart Failure Maternal Grandmother. Cerebrovascular Accident Maternal Grandmother.  Social History  Number of flights of stairs before winded 2-3 Tobacco / smoke exposure 04/22/2014: yes Not under pain contract Marital status married No history of drug/alcohol rehab Tobacco use Current every day smoker. 04/22/2014: smoke(d) less than 1/2 pack(s) per day Exercise Exercises rarely; does gym / weights Living situation live with spouse Current work status working full time Children 3 Current drinker 04/22/2014: Currently drinks beer less than 5 times per week  Medication History Etodolac (Oral) Specific dose unknown - Active. Sertraline HCl (Oral) Specific dose unknown - Active.  Past Surgical History No pertinent past surgical history  Review of Systems General Not Present- Chills, Fatigue, Fever, Memory Loss, Night Sweats, Weight Gain and Weight Loss. Skin Not Present- Eczema, Hives, Itching, Lesions and Rash. HEENT Not Present- Dentures, Double Vision, Headache, Hearing Loss, Tinnitus and Visual Loss. Respiratory Not Present- Allergies, Chronic Cough, Coughing up blood, Shortness of breath at rest and Shortness of breath with exertion. Cardiovascular Not Present- Chest Pain, Difficulty Breathing Lying Down, Murmur, Palpitations, Racing/skipping heartbeats and Swelling. Gastrointestinal Not Present- Abdominal Pain, Bloody Stool, Constipation, Diarrhea, Difficulty Swallowing, Heartburn, Jaundice, Loss of appetitie, Nausea and Vomiting. Male  Genitourinary Not Present- Blood in Urine, Discharge, Flank Pain, Incontinence, Painful Urination, Urgency, Urinary  frequency, Urinary Retention, Urinating at Night and Weak urinary stream. Musculoskeletal Present- Joint Pain (left hip pain). Not Present- Back Pain, Joint Swelling, Morning Stiffness, Muscle Pain, Muscle Weakness and Spasms. Neurological Not Present- Blackout spells, Difficulty with balance, Dizziness, Paralysis, Tremor and Weakness. Psychiatric Not Present- Insomnia.  Vitals  Weight: 145 lb Height: 69in Weight was reported by patient. Height was reported by patient. Body Surface Area: 1.79 m Body Mass Index: 21.41 kg/m  Pulse: 68 (Regular)  BP: 108/68 (Sitting, Left Arm, Standard)   Physical Exam General Mental Status -Alert, cooperative and good historian. General Appearance-pleasant, Not in acute distress. Orientation-Oriented X3. Build & Nutrition-Lean, Well nourished and Well developed.  Head and Neck Head-normocephalic, atraumatic . Neck Global Assessment - supple, no bruit auscultated on the right, no bruit auscultated on the left.  Eye Pupil - Bilateral-Regular and Round. Motion - Bilateral-EOMI.  Chest and Lung Exam Auscultation Breath sounds - clear at anterior chest wall and clear at posterior chest wall. Adventitious sounds - No Adventitious sounds.  Cardiovascular Auscultation Rhythm - Regular rate and rhythm. Heart Sounds - S1 WNL and S2 WNL. Murmurs & Other Heart Sounds - Auscultation of the heart reveals - No Murmurs.  Abdomen Palpation/Percussion Tenderness - Abdomen is non-tender to palpation. Rigidity (guarding) - Abdomen is soft. Auscultation Auscultation of the abdomen reveals - Bowel sounds normal.  Male Genitourinary Note: Not done, not pertinent to present illness   Musculoskeletal Note: He is alert and oriented. No apparent distress. The right hip has a normal range of motion and no discomfort. Left  hip flexion to 100, no internal rotation. There is about 10 external rotation and 10 abduction.  RADIOGRAPHS: His radiographs are reviewed. AP of the pelvis and lateral of the left hip shows bone on bone arthritis in the left hip.   Assessment & Plan  Primary osteoarthritis of left hip (M16.12) Note:Plan is for a Left Total Hip Replacement - anterior approach by Dr. Lequita HaltAluisio.  Plan is to go home with wife and family.  PCP - Dr. Rita OharaSamuel Kelley  The patient does not have any contraindications and will receive TXA (tranexamic acid) prior to surgery.  Signed electronically by Lauraine RinneAlexzandrew L Andi Layfield, III PA-C

## 2014-11-04 NOTE — Plan of Care (Signed)
Problem: Phase I Progression Outcomes Goal: Dangle or out of bed evening of surgery Outcome: Completed/Met Date Met:  11/04/14     

## 2014-11-04 NOTE — Interval H&P Note (Signed)
History and Physical Interval Note:  11/04/2014 1:41 PM  Glenn Dean  has presented today for surgery, with the diagnosis of OA LEFT HIP  The various methods of treatment have been discussed with the patient and family. After consideration of risks, benefits and other options for treatment, the patient has consented to  Procedure(s): LEFT TOTAL HIP ARTHROPLASTY ANTERIOR APPROACH (Left) as a surgical intervention .  The patient's history has been reviewed, patient examined, no change in status, stable for surgery.  I have reviewed the patient's chart and labs.  Questions were answered to the patient's satisfaction.     Loanne DrillingALUISIO,Radames Mejorado V

## 2014-11-04 NOTE — Anesthesia Preprocedure Evaluation (Addendum)
Anesthesia Evaluation  Patient identified by MRN, date of birth, ID band Patient awake    Reviewed: Allergy & Precautions, H&P , NPO status , Patient's Chart, lab work & pertinent test results  Airway Mallampati: II  TM Distance: >3 FB Neck ROM: full    Dental  (+) Dental Advisory Given, Caps Right upper front is repaired tooth from large break:   Pulmonary neg pulmonary ROS, Current Smoker,  breath sounds clear to auscultation  Pulmonary exam normal       Cardiovascular Exercise Tolerance: Good negative cardio ROS  Rhythm:regular Rate:Normal     Neuro/Psych negative neurological ROS  negative psych ROS   GI/Hepatic negative GI ROS, Neg liver ROS,   Endo/Other  negative endocrine ROS  Renal/GU negative Renal ROS  negative genitourinary   Musculoskeletal   Abdominal   Peds  Hematology negative hematology ROS (+)   Anesthesia Other Findings   Reproductive/Obstetrics negative OB ROS                           Anesthesia Physical Anesthesia Plan  ASA: II  Anesthesia Plan: General   Post-op Pain Management:    Induction: Intravenous  Airway Management Planned: Oral ETT  Additional Equipment:   Intra-op Plan:   Post-operative Plan: Extubation in OR  Informed Consent: I have reviewed the patients History and Physical, chart, labs and discussed the procedure including the risks, benefits and alternatives for the proposed anesthesia with the patient or authorized representative who has indicated his/her understanding and acceptance.   Dental Advisory Given  Plan Discussed with: CRNA and Surgeon  Anesthesia Plan Comments:        Anesthesia Quick Evaluation

## 2014-11-04 NOTE — Plan of Care (Signed)
Problem: Consults Goal: Total Joint Replacement Patient Education See Patient Education Module for education specifics. Outcome: Progressing Goal: Diagnosis- Total Joint Replacement Primary Total Hip Goal: Skin Care Protocol Initiated - if Braden Score 18 or less If consults are not indicated, leave blank or document N/A Outcome: Not Applicable Date Met:  49/35/52 Goal: Nutrition Consult-if indicated Outcome: Not Applicable Date Met:  17/47/15 Goal: Diabetes Guidelines if Diabetic/Glucose > 140 If diabetic or lab glucose is > 140 mg/dl - Initiate Diabetes/Hyperglycemia Guidelines & Document Interventions  Outcome: Not Applicable Date Met:  95/39/67  Problem: Phase I Progression Outcomes Goal: CMS/Neurovascular status WDL Outcome: Progressing Goal: Pain controlled with appropriate interventions Outcome: Progressing

## 2014-11-04 NOTE — Op Note (Signed)
OPERATIVE REPORT  PREOPERATIVE DIAGNOSIS: Osteoarthritis of the Left hip.   POSTOPERATIVE DIAGNOSIS: Osteoarthritis of the Left  hip.   PROCEDURE: Left total hip arthroplasty, anterior approach.   SURGEON: Ollen GrossFrank Tanika Bracco, MD   ASSISTANT: Avel Peacerew Perkins, PA-C  ANESTHESIA:  General  ESTIMATED BLOOD LOSS:-425 ml   DRAINS: Hemovac x1.   COMPLICATIONS: None   CONDITION: PACU - hemodynamically stable.   BRIEF CLINICAL NOTE: Glenn Dean is a 43 y.o. male who has advanced end-  stage arthritis of his Left  hip with progressively worsening pain and  dysfunction.The patient has failed nonoperative management and presents for  total hip arthroplasty.   PROCEDURE IN DETAIL: After successful administration of spinal  anesthetic, the traction boots [facility] were placed on both  feet and the [facility], boots placed into the leg  holders. The Left hip was then isolated from the perineum with plastic  drapes and prepped and draped in the usual sterile fashion. ASIS and  greater trochanter were marked and a oblique incision was made, starting  at about 1 cm lateral and 2 cm distal to the ASIS and coursing towards  the anterior cortex of the femur. The skin was cut with a 10 blade  through subcutaneous tissue to the level of the fascia overlying the  tensor fascia lata muscle. The fascia was then incised in line with the  incision at the junction of the anterior third and posterior 2/3rd. The  muscle was teased off the fascia and then the interval between the TFL  and the rectus was developed. The Hohmann retractor was then placed at  the top of the femoral neck over the capsule. The vessels overlying the  capsule were cauterized and the fat on top of the capsule was removed.  A Hohmann retractor was then placed anterior underneath the rectus  femoris to give exposure to the entire anterior capsule. A T-shaped  capsulotomy was performed. The  edges were tagged and the femoral head  was identified.       Osteophytes are removed off the superior acetabulum.  The femoral neck was then cut in situ with an oscillating saw. Traction  was then applied to the left lower extremity utilizing the [facility]  traction. The femoral head was then removed. Retractors were placed  around the acetabulum and then circumferential removal of the labrum was  performed. Osteophytes were also removed. Reaming starts at 45 mm to  medialize and  Increased in 2 mm increments to 51 mm. We reamed in  approximately 40 degrees of abduction, 20 degrees anteversion. A 52 mm  pinnacle acetabular shell was then impacted in anatomic position under  fluoroscopic guidance with excellent purchase. We did not need to place  any additional dome screws. A 32 mm Neutral + 4 marathon liner was then  placed into the acetabular shell.       The femoral lift was then placed along the lateral aspect of the femur  just distal to the vastus ridge. The leg was  externally rotated and capsule  was stripped off the inferior aspect of the femoral neck down to the  level of the lesser trochanter, this was done with electrocautery. The femur was lifted after this was performed. The  leg was then placed and extended in adducted position to essentially delivering the femur. We also removed the capsule superiorly and the  piriformis from the piriformis  fossa to gain excellent exposure of the  proximal femur. Rongeur was used to remove some cancellous bone to get  into the lateral portion of the proximal femur for placement of the  initial starter reamer. The starter broaches was placed  the starter broach  and was shown to go down the center of the canal. Broaching  with the  Corail system was then performed starting at size 8, coursing  Up to size 11. A size 11 had excellent torsional and rotational  and axial stability. The trial standard offset neck was then placed  with a 32 + 1 trial  head. The hip was then reduced. We confirmed that  the stem was in the canal both on AP and lateral x-rays. It also has excellent sizing. The hip was reduced with outstanding stability through full extension, full external rotation,  and then flexion in adduction internal rotation. AP pelvis was taken  and the leg lengths were measured and found to be exactly equal. Hip  was then dislocated again and the femoral head and neck removed. The  femoral broach was removed. Size 11 Corail stem with a standard offset  neck was then impacted into the femur following native anteversion. Has  excellent purchase in the canal. Excellent torsional and rotational and  axial stability. It is confirmed to be in the canal on AP and lateral  fluoroscopic views. The 32 + 1 ceramic head was placed and the hip  reduced with outstanding stability. Again AP pelvis was taken and it  confirmed that the leg lengths were equal. The wound was then copiously  irrigated with saline solution and the capsule reattached and repaired  with Ethibond suture.  20 mL of Exparel mixed with 50 mL of saline then additional 20 ml of .25% Bupivicaine injected into the capsule and into the edge of the tensor fascia lata as well as subcutaneous tissue. The fascia overlying the tensor fascia lata was  then closed with a running #1 V-Loc. Subcu was closed with interrupted  2-0 Vicryl and subcuticular running 4-0 Monocryl. Incision was cleaned  and dried. Steri-Strips and a bulky sterile dressing applied. Hemovac  drain was hooked to suction and then he was awakened and transported to  recovery in stable condition.        Please note that a surgical assistant was a medical necessity for this procedure to perform it in a safe and expeditious manner. Assistant was necessary to provide appropriate retraction of vital neurovascular structures and to prevent femoral fracture and allow for anatomic placement of the prosthesis.  Gaynelle Arabian, M.D.

## 2014-11-04 NOTE — Anesthesia Postprocedure Evaluation (Signed)
  Anesthesia Post-op Note  Patient: Glenn Dean  Procedure(s) Performed: Procedure(s) (LRB): LEFT TOTAL HIP ARTHROPLASTY ANTERIOR APPROACH (Left)  Patient Location: PACU  Anesthesia Type: General  Level of Consciousness: awake and alert   Airway and Oxygen Therapy: Patient Spontanous Breathing  Post-op Pain: mild  Post-op Assessment: Post-op Vital signs reviewed, Patient's Cardiovascular Status Stable, Respiratory Function Stable, Patent Airway and No signs of Nausea or vomiting  Last Vitals:  Filed Vitals:   11/04/14 1630  BP: 127/75  Pulse: 94  Temp:   Resp: 14    Post-op Vital Signs: stable   Complications: No apparent anesthesia complications

## 2014-11-05 ENCOUNTER — Encounter (HOSPITAL_COMMUNITY): Payer: Self-pay | Admitting: Orthopedic Surgery

## 2014-11-05 LAB — BASIC METABOLIC PANEL
ANION GAP: 10 (ref 5–15)
BUN: 10 mg/dL (ref 6–23)
CO2: 25 mEq/L (ref 19–32)
Calcium: 8.9 mg/dL (ref 8.4–10.5)
Chloride: 101 mEq/L (ref 96–112)
Creatinine, Ser: 1 mg/dL (ref 0.50–1.35)
GFR calc Af Amer: >90 mL/min (ref >90)
GFR calc non Af Amer: 89 mL/min — ABNORMAL LOW (ref >90)
Glucose, Bld: 157 mg/dL — ABNORMAL HIGH (ref 70–99)
Potassium: 4 mEq/L (ref 3.7–5.3)
Sodium: 136 mEq/L — ABNORMAL LOW (ref 137–147)

## 2014-11-05 LAB — CBC
HCT: 35 % — ABNORMAL LOW (ref 39.0–52.0)
Hemoglobin: 11.9 g/dL — ABNORMAL LOW (ref 13.0–17.0)
MCH: 31.6 pg (ref 26.0–34.0)
MCHC: 34 g/dL (ref 30.0–36.0)
MCV: 93.1 fL (ref 78.0–100.0)
PLATELETS: 192 10*3/uL (ref 150–400)
RBC: 3.76 MIL/uL — ABNORMAL LOW (ref 4.22–5.81)
RDW: 13 % (ref 11.5–15.5)
WBC: 9.7 10*3/uL (ref 4.0–10.5)

## 2014-11-05 MED ORDER — METHOCARBAMOL 500 MG PO TABS: 500.0000 mg | ORAL_TABLET | Freq: Four times a day (QID) | ORAL | Status: DC | PRN

## 2014-11-05 MED ORDER — RIVAROXABAN 10 MG PO TABS: 10.0000 mg | ORAL_TABLET | Freq: Every day | ORAL | Status: DC

## 2014-11-05 MED ORDER — HYDROMORPHONE HCL 2 MG PO TABS: 2.0000 mg | ORAL_TABLET | ORAL | Status: DC | PRN

## 2014-11-05 NOTE — Progress Notes (Signed)
   Subjective: 1 Day Post-Op Procedure(s) (LRB): LEFT TOTAL HIP ARTHROPLASTY ANTERIOR APPROACH (Left) Patient reports pain as mild and moderate.   Patient seen in rounds for Dr. Lequita HaltAluisio.  Had some pain and discomfort last night.  Encouraged PO pain meds today.  Did not get up yesterday.  Will see how he does today with therapy.  If does great and wants to go home, maybe this evening, but more likely tomorrow since he has not even been up yet. Patient is well, but has had some minor complaints of pain in the hip, requiring pain medications We will start therapy today.  Plan is to go Home after hospital stay.  Objective: Vital signs in last 24 hours: Temp:  [97.6 F (36.4 C)-98.9 F (37.2 C)] 98.4 F (36.9 C) (11/12 0531) Pulse Rate:  [67-103] 91 (11/12 0531) Resp:  [14-18] 14 (11/12 0531) BP: (120-153)/(51-75) 153/51 mmHg (11/12 0531) SpO2:  [98 %-100 %] 100 % (11/12 0531) Weight:  [67.132 kg (148 lb)] 67.132 kg (148 lb) (11/11 1222)  Intake/Output from previous day:  Intake/Output Summary (Last 24 hours) at 11/05/14 0910 Last data filed at 11/05/14 0600  Gross per 24 hour  Intake   4260 ml  Output   3875 ml  Net    385 ml    Intake/Output this shift:    Labs:  Recent Labs  11/05/14 0431  HGB 11.9*    Recent Labs  11/05/14 0431  WBC 9.7  RBC 3.76*  HCT 35.0*  PLT 192    Recent Labs  11/05/14 0431  NA 136*  K 4.0  CL 101  CO2 25  BUN 10  CREATININE 1.00  GLUCOSE 157*  CALCIUM 8.9   No results for input(s): LABPT, INR in the last 72 hours.  EXAM General - Patient is Alert, Appropriate and Oriented Extremity - Neurovascular intact Sensation intact distally Dorsiflexion/Plantar flexion intact Dressing - dressing C/D/I Motor Function - intact, moving foot and toes well on exam.  Hemovac pulled without difficulty.  Past Medical History  Diagnosis Date  . Depression   . Arthritis     Assessment/Plan: 1 Day Post-Op Procedure(s) (LRB): LEFT  TOTAL HIP ARTHROPLASTY ANTERIOR APPROACH (Left) Principal Problem:   OA (osteoarthritis) of hip  Estimated body mass index is 21.85 kg/(m^2) as calculated from the following:   Height as of this encounter: 5\' 9"  (1.753 m).   Weight as of this encounter: 67.132 kg (148 lb). Advance diet Up with therapy Discharge home with home health when ready.  DVT Prophylaxis - Xarelto Weight Bearing As Tolerated left Leg Hemovac Pulled Begin Therapy  Diet - Regular F/U in two weeks Dispositon - HOME when ready DC Meds - see summary  Avel Peacerew Draylen Lobue, PA-C Orthopaedic Surgery 11/05/2014, 9:10 AM

## 2014-11-05 NOTE — Discharge Instructions (Signed)
Dr. Gaynelle Arabian Total Joint Specialist [facility] [address] [place], [place] [address] [phone]    ANTERIOR APPROACH TOTAL HIP REPLACEMENT POSTOPERATIVE DIRECTIONS   Hip Rehabilitation, Guidelines Following Surgery  The results of a hip operation are greatly improved after range of motion and muscle strengthening exercises. Follow all safety measures which are given to protect your hip. If any of these exercises cause increased pain or swelling in your joint, decrease the amount until you are comfortable again. Then slowly increase the exercises. Call your caregiver if you have problems or questions.  HOME CARE INSTRUCTIONS  Most of the following instructions are designed to prevent the dislocation of your new hip.  Remove items at home which could result in a fall. This includes throw rugs or furniture in walking pathways.  Continue medications as instructed at time of discharge.  You may have some home medications which will be placed on hold until you complete the course of blood thinner medication.  You may start showering once you are discharged home but do not submerge the incision under water. Just pat the incision dry and apply a dry gauze dressing on daily. Do not put on socks or shoes without following the instructions of your caregivers.  Sit on high chairs which makes it easier to stand.  Sit on chairs with arms. Use the chair arms to help push yourself up when arising.  Keep your leg on the side of the operation out in front of you when standing up.  Arrange for the use of a toilet seat elevator so you are not sitting low.    Walk with walker as instructed.  You may resume a sexual relationship in one month or when given the OK by your caregiver.  Use walker as long as suggested by your caregivers.  You may put full weight on your legs and walk as much as is comfortable. Avoid periods of inactivity such as sitting longer than an hour  when not asleep. This helps prevent blood clots.  You may return to work once you are cleared by Engineer, production.  Do not drive a car for 6 weeks or until released by your surgeon.  Do not drive while taking narcotics.  Wear elastic stockings for three weeks following surgery during the day but you may remove then at night.  Make sure you keep all of your appointments after your operation with all of your doctors and caregivers. You should call the office at the above phone number and make an appointment for approximately two weeks after the date of your surgery. Change the dressing daily and reapply a dry dressing each time. Please pick up a stool softener and laxative for home use as long as you are requiring pain medications.  Continue to use ice on the hip for pain and swelling from surgery. You may notice swelling that will progress down to the foot and ankle.  This is normal after surgery.  Elevate the leg when you are not up walking on it.   It is important for you to complete the blood thinner medication as prescribed by your doctor.  Continue to use the breathing machine which will help keep your temperature down.  It is common for your temperature to cycle up and down following surgery, especially at night when you are not up moving around and exerting yourself.  The breathing machine keeps your lungs expanded and your temperature down.  RANGE OF MOTION AND STRENGTHENING EXERCISES  These exercises are designed to help you keep full movement of your hip joint. Follow your caregiver's or physical therapist's instructions. Perform all exercises about fifteen times, three times per day or as directed. Exercise both hips, even if you have had only one joint replacement. These exercises can be done on a training (exercise) mat, on the floor, on a table or on a bed. Use whatever works the best and is most comfortable for you. Use music or television while you are exercising so that the exercises are a  pleasant break in your day. This will make your life better with the exercises acting as a break in routine you can look forward to.  Lying on your back, slowly slide your foot toward your buttocks, raising your knee up off the floor. Then slowly slide your foot back down until your leg is straight again.  Lying on your back spread your legs as far apart as you can without causing discomfort.  Lying on your side, raise your upper leg and foot straight up from the floor as far as is comfortable. Slowly lower the leg and repeat.  Lying on your back, tighten up the muscle in the front of your thigh (quadriceps muscles). You can do this by keeping your leg straight and trying to raise your heel off the floor. This helps strengthen the largest muscle supporting your knee.  Lying on your back, tighten up the muscles of your buttocks both with the legs straight and with the knee bent at a comfortable angle while keeping your heel on the floor.   SKILLED REHAB INSTRUCTIONS: If the patient is transferred to a skilled rehab facility following release from the hospital, a list of the current medications will be sent to the facility for the patient to continue.  When discharged from the skilled rehab facility, please have the facility set up the patient's [place] prior to being released. Also, the skilled facility will be responsible for providing the patient with their medications at time of release from the facility to include their pain medication, the muscle relaxants, and their blood thinner medication. If the patient is still at the rehab facility at time of the two week follow up appointment, the skilled rehab facility will also need to assist the patient in arranging follow up appointment in our office and any transportation needs.  MAKE SURE YOU:  Understand these instructions.  Will watch your condition.  Will get help right away if you are not doing well or get worse.  Pick up  stool softner and laxative for home. Do not submerge incision under water. May shower. Continue to use ice for pain and swelling from surgery. Total Hip Protocol.  Take Xarelto for two and a half more weeks, then discontinue Xarelto. Once the patient has completed the blood thinner regimen, then take a Baby 81 mg Aspirin daily for three more weeks.   Postoperative Constipation Protocol  Constipation - defined medically as fewer than three stools per week and severe constipation as less than one stool per week.  One of the most common issues patients have following surgery is constipation.  Even if you have a regular bowel pattern at home, your normal regimen is likely to be disrupted due to multiple reasons following surgery.  Combination of anesthesia, postoperative narcotics, change in appetite and fluid intake all can affect your bowels.  In order to avoid complications following surgery, here are some recommendations in order to help you during your  recovery period.  Colace (docusate) - Pick up an over-the-counter form of Colace or another stool softener and take twice a day as long as you are requiring postoperative pain medications.  Take with a full glass of water daily.  If you experience loose stools or diarrhea, hold the colace until you stool forms back up.  If your symptoms do not get better within 1 week or if they get worse, check with your doctor.  Dulcolax (bisacodyl) - Pick up over-the-counter and take as directed by the product packaging as needed to assist with the movement of your bowels.  Take with a full glass of water.  Use this product as needed if not relieved by Colace only.   MiraLax (polyethylene glycol) - Pick up over-the-counter to have on hand.  MiraLax is a solution that will increase the amount of water in your bowels to assist with bowel movements.  Take as directed and can mix with a glass of water, juice, soda, coffee, or tea.  Take if you go more than two days  without a movement. Do not use MiraLax more than once per day. Call your doctor if you are still constipated or irregular after using this medication for 7 days in a row.  If you continue to have problems with postoperative constipation, please contact the office for further assistance and recommendations.  If you experience "the worst abdominal pain ever" or develop nausea or vomiting, please contact the office immediatly for further recommendations for treatment.   Information on my medicine - XARELTO (Rivaroxaban)  This medication education was reviewed with me or my healthcare representative as part of my discharge preparation.  The pharmacist that spoke with me during my hospital stay was:  Jamse MeadGadhia, Jakeob Tullis M, [facility]  Why was Xarelto prescribed for you? Xarelto was prescribed for you to reduce the risk of blood clots forming after orthopedic surgery. The medical term for these abnormal blood clots is venous thromboembolism (VTE).  What do you need to know about xarelto ? Take your Xarelto ONCE DAILY at the same time every day. You may take it either with or without food.  If you have difficulty swallowing the tablet whole, you may crush it and mix in applesauce just prior to taking your dose.  Take Xarelto exactly as prescribed by your doctor and DO NOT stop taking Xarelto without talking to the doctor who prescribed the medication.  Stopping without other VTE prevention medication to take the place of Xarelto may increase your risk of developing a clot.  After discharge, you should have regular check-up appointments with your healthcare provider that is prescribing your Xarelto.    What do you do if you miss a dose? If you miss a dose, take it as soon as you remember on the same day then continue your regularly scheduled once daily regimen the next day. Do not take two doses of Xarelto on the same day.   Important Safety Information A possible side effect of Xarelto is bleeding.  You should call your healthcare provider right away if you experience any of the following: ? Bleeding from an injury or your nose that does not stop. ? Unusual colored urine (red or dark brown) or unusual colored stools (red or black). ? Unusual bruising for unknown reasons. ? A serious fall or if you hit your head (even if there is no bleeding).  Some medicines may interact with Xarelto and might increase your risk of bleeding while on Xarelto. To help avoid  this, consult your healthcare provider or pharmacist prior to using any new prescription or non-prescription medications, including herbals, vitamins, non-steroidal anti-inflammatory drugs (NSAIDs) and supplements.  This website has more information on Xarelto: [email or web address]

## 2014-11-05 NOTE — Plan of Care (Signed)
Problem: Phase I Progression Outcomes Goal: Initial discharge plan identified Outcome: Completed/Met Date Met:  11/05/14  Problem: Phase II Progression Outcomes Goal: Ambulates Outcome: Completed/Met Date Met:  11/05/14 Goal: Tolerating diet Outcome: Completed/Met Date Met:  11/05/14 Goal: Discharge plan established Outcome: Completed/Met Date Met:  11/05/14     

## 2014-11-05 NOTE — Care Management Note (Signed)
    Page 1 of 2   11/05/2014     8:27:50 AM CARE MANAGEMENT NOTE 11/05/2014  Patient:  Glenn Dean, Glenn Dean   Account Number:  [id number]  Date Initiated:  11/05/2014  Documentation initiated by:  [facility]  Subjective/Objective Assessment:   adm: Left total hip arthroplasty, anterior approach     Action/Plan:   discharge planning   Anticipated DC Date:  11/05/2014   Anticipated DC Plan:  [place]  CM consult      [facility] Choice  HOME HEALTH   Choice offered to / List presented to:  C-1 Patient   DME arranged  3-N-1  Vassie Moselle      DME agency  [place] arranged  [place]   Status of service:  Completed, signed off Medicare Important Message given?   (If response is "NO", the following Medicare IM given date fields will be blank) Date Medicare IM given:   Medicare IM given by:   Date Additional Medicare IM given:   Additional Medicare IM given by:    Discharge Disposition:  [place]  Per UR Regulation:    If discussed at Long Length of Stay Meetings, dates discussed:    Comments:  11/05/14 07:30 CM met with pt in room to offer choice of home health agency.  [facility] chooses [facility]. address of pt is: [address] [place], [place], [place] [address].  Cm called [place] DME rep to please deliver 3n1 and rolling walker to room prior to discharge today.  Referral called to [facility] with notation of address.  Charge RN gave CM note requesting OP note to be faxed to [place] attn: Mrs. Dianah Field [phone].  CM requested verification and permission from pt who states this is acurate and please fax.  CM faxed OP note per request.  No other CM needs were communicated.  Mariane Masters, BSN, CM [phone].

## 2014-11-05 NOTE — Discharge Summary (Signed)
Physician Discharge Summary   Patient ID: Glenn Dean MRN: [id number] DOB/AGE: 45-Sep-1972 45 y.o.  Admit date: 11/04/2014 Discharge date: 11-06-2014  Primary Diagnosis:  Osteoarthritis of the Left hip.  Admission Diagnoses:  Past Medical History  Diagnosis Date  . Depression   . Arthritis    Discharge Diagnoses:   Principal Problem:   OA (osteoarthritis) of hip  Estimated body mass index is 21.85 kg/(m^2) as calculated from the following:   Height as of this encounter: 5' 9" (1.753 m).   Weight as of this encounter: 67.132 kg (148 lb).  Procedure(s) (LRB): LEFT TOTAL HIP ARTHROPLASTY ANTERIOR APPROACH (Left)   Consults: None  HPI: Glenn Dean is a 43 y.o. male who has advanced end-  stage arthritis of his Left hip with progressively worsening pain and  dysfunction.The patient has failed nonoperative management and presents for  total hip arthroplasty.  Laboratory Data: Admission on 11/04/2014  Component Date Value Ref Range Status  . ABO/RH(D) 11/04/2014 O POS   Final  . Antibody Screen 11/04/2014 NEG   Final  . Sample Expiration 11/04/2014 11/07/2014   Final  . ABO/RH(D) 11/04/2014 O POS   Final  . WBC 11/05/2014 9.7  4.0 - 10.5 K/uL Final  . RBC 11/05/2014 3.76* 4.22 - 5.81 MIL/uL Final  . Hemoglobin 11/05/2014 11.9* 13.0 - 17.0 g/dL Final  . HCT 11/05/2014 35.0* 39.0 - 52.0 % Final  . MCV 11/05/2014 93.1  78.0 - 100.0 fL Final  . MCH 11/05/2014 31.6  26.0 - 34.0 pg Final  . MCHC 11/05/2014 34.0  30.0 - 36.0 g/dL Final  . RDW 11/05/2014 13.0  11.5 - 15.5 % Final  . Platelets 11/05/2014 192  150 - 400 K/uL Final  . Sodium 11/05/2014 136* 137 - 147 mEq/L Final  . Potassium 11/05/2014 4.0  3.7 - 5.3 mEq/L Final  . Chloride 11/05/2014 101  96 - 112 mEq/L Final  . CO2 11/05/2014 25  19 - 32 mEq/L Final  . Glucose, Bld 11/05/2014 157* 70 - 99 mg/dL Final  . BUN 11/05/2014 10  6 - 23 mg/dL Final  . Creatinine, Ser 11/05/2014 1.00  0.50 - 1.35 mg/dL  Final  . Calcium 11/05/2014 8.9  8.4 - 10.5 mg/dL Final  . GFR calc non Af Amer 11/05/2014 89* >90 mL/min Final  . GFR calc Af Amer 11/05/2014 >90  >90 mL/min Final   Comment: (NOTE) The eGFR has been calculated using the CKD EPI equation. This calculation has not been validated in all clinical situations. eGFR's persistently <90 mL/min signify possible Chronic Kidney Disease.   Georgiann Hahn gap 11/05/2014 10  5 - 15 Final  Hospital Outpatient Visit on 10/27/2014  Component Date Value Ref Range Status  . aPTT 10/27/2014 29  24 - 37 seconds Final  . WBC 10/27/2014 3.5* 4.0 - 10.5 K/uL Final  . RBC 10/27/2014 4.47  4.22 - 5.81 MIL/uL Final  . Hemoglobin 10/27/2014 14.2  13.0 - 17.0 g/dL Final  . HCT 10/27/2014 41.2  39.0 - 52.0 % Final  . MCV 10/27/2014 92.2  78.0 - 100.0 fL Final  . MCH 10/27/2014 31.8  26.0 - 34.0 pg Final  . MCHC 10/27/2014 34.5  30.0 - 36.0 g/dL Final  . RDW 10/27/2014 12.9  11.5 - 15.5 % Final  . Platelets 10/27/2014 219  150 - 400 K/uL Final  . Sodium 10/27/2014 141  137 - 147 mEq/L Final  . Potassium 10/27/2014 4.1  3.7 - 5.3 mEq/L Final  .  Chloride 10/27/2014 103  96 - 112 mEq/L Final  . CO2 10/27/2014 29  19 - 32 mEq/L Final  . Glucose, Bld 10/27/2014 93  70 - 99 mg/dL Final  . BUN 10/27/2014 8  6 - 23 mg/dL Final  . Creatinine, Ser 10/27/2014 1.02  0.50 - 1.35 mg/dL Final  . Calcium 10/27/2014 9.7  8.4 - 10.5 mg/dL Final  . Total Protein 10/27/2014 7.9  6.0 - 8.3 g/dL Final  . Albumin 10/27/2014 3.9  3.5 - 5.2 g/dL Final  . AST 10/27/2014 18  0 - 37 U/L Final  . ALT 10/27/2014 20  0 - 53 U/L Final  . Alkaline Phosphatase 10/27/2014 55  39 - 117 U/L Final  . Total Bilirubin 10/27/2014 0.4  0.3 - 1.2 mg/dL Final  . GFR calc non Af Amer 10/27/2014 87* >90 mL/min Final  . GFR calc Af Amer 10/27/2014 >90  >90 mL/min Final   Comment: (NOTE) The eGFR has been calculated using the CKD EPI equation. This calculation has not been validated in all clinical  situations. eGFR's persistently <90 mL/min signify possible Chronic Kidney Disease.   . Anion gap 10/27/2014 9  5 - 15 Final  . Prothrombin Time 10/27/2014 13.8  11.6 - 15.2 seconds Final  . INR 10/27/2014 1.05  0.00 - 1.49 Final  . Color, Urine 10/27/2014 YELLOW  YELLOW Final  . APPearance 10/27/2014 CLOUDY* CLEAR Final  . Specific Gravity, Urine 10/27/2014 1.008  1.005 - 1.030 Final  . pH 10/27/2014 7.0  5.0 - 8.0 Final  . Glucose, UA 10/27/2014 NEGATIVE  NEGATIVE mg/dL Final  . Hgb urine dipstick 10/27/2014 NEGATIVE  NEGATIVE Final  . Bilirubin Urine 10/27/2014 NEGATIVE  NEGATIVE Final  . Ketones, ur 10/27/2014 NEGATIVE  NEGATIVE mg/dL Final  . Protein, ur 10/27/2014 NEGATIVE  NEGATIVE mg/dL Final  . Urobilinogen, UA 10/27/2014 0.2  0.0 - 1.0 mg/dL Final  . Nitrite 10/27/2014 NEGATIVE  NEGATIVE Final  . Leukocytes, UA 10/27/2014 NEGATIVE  NEGATIVE Final   MICROSCOPIC NOT DONE ON URINES WITH NEGATIVE PROTEIN, BLOOD, LEUKOCYTES, NITRITE, OR GLUCOSE <1000 mg/dL.  Marland Kitchen MRSA, PCR 10/27/2014 NEGATIVE  NEGATIVE Final  . Staphylococcus aureus 10/27/2014 NEGATIVE  NEGATIVE Final   Comment:        The Xpert SA Assay (FDA approved for NASAL specimens in patients over 82 years of age), is one component of a comprehensive surveillance program.  Test performance has been validated by [employer] for patients greater than or equal to 23 year old. It is not intended to diagnose infection nor to guide or monitor treatment.      X-Rays:Dg Hip Complete Left  10/27/2014   CLINICAL DATA:  Preop for total left hip arthroplasty.  EXAM: LEFT HIP - COMPLETE 2+ VIEW  COMPARISON:  None.  FINDINGS: Advanced degenerative changes involving the left hip with joint space narrowing, osteophytic spurring, subchondral cystic change and bony eburnation. No definite findings for avascular necrosis. The pubic symphysis and SI joints are intact. Bilateral SI joint degenerative changes are noted. The right hip is  intact. Minimal degenerative changes.  IMPRESSION: Age advanced left hip joint degenerative changes as discussed above.  Bilateral SI joint degenerative changes.   Electronically Signed   By: Kalman Jewels M.D.   On: 10/27/2014 09:17   Dg Pelvis Portable  11/04/2014   CLINICAL DATA:  Status post left anterior hip replacement.  EXAM: PORTABLE PELVIS 1-2 VIEWS  COMPARISON:  None.  FINDINGS: There is a total left hip arthroplasty.  No periprosthetic fracture or dislocation. Soft tissue gas and surgical drain present.  IMPRESSION: Total left hip arthroplasty.  No adverse findings.   Electronically Signed   By: Jorje Guild M.D.   On: 11/04/2014 16:55   Dg C-arm 1-60 Min-no Report  11/04/2014   CLINICAL DATA: surgery   C-ARM 1-60 MINUTES  Fluoroscopy was utilized by the requesting physician.  No radiographic  interpretation.     EKG:No orders found for this or any previous visit.   Hospital Course: Patient was admitted to [facility] and taken to the OR and underwent the above state procedure without complications.  Patient tolerated the procedure well and was later transferred to the recovery room and then to the orthopaedic floor for postoperative care.  They were given PO and IV analgesics for pain control following their surgery.  They were given 24 hours of postoperative antibiotics of  Anti-infectives    Start     Dose/Rate Route Frequency Ordered Stop   11/04/14 2000  ceFAZolin (ANCEF) IVPB 2 g/50 mL premix     2 g100 mL/hr over 30 Minutes Intravenous Every 6 hours 11/04/14 1715 11/05/14 0148   11/04/14 1153  ceFAZolin (ANCEF) IVPB 2 g/50 mL premix     2 g100 mL/hr over 30 Minutes Intravenous On call to O.R. 11/04/14 1153 11/04/14 1410     and started on DVT prophylaxis in the form of Xarelto.   PT and OT were ordered for total hip protocol.  The patient was allowed to be WBAT with therapy. Discharge planning was consulted to help with postop disposition and equipment needs.   Patient had a tough night on the evening of surgery.  They started to get up OOB with therapy on day one.  Hemovac drain was pulled without difficulty.  Continued to work with therapy into day two.  Dressing was changed on day two and the incision was healing well. Patient was seen in rounds by Dr. Wynelle Link and was ready to go home on day two.  Discharge home with home health Diet - Regular diet Follow up - in 2 weeks on Tuesday 11/17/2013 Activity - WBAT Disposition - Home Condition Upon Discharge - Good D/C Meds - See DC Summary DVT Prophylaxis - Xarelto  Discharge Instructions    Call MD / Call 911    Complete by:  As directed   If you experience chest pain or shortness of breath, CALL 911 and be transported to the hospital emergency room.  If you develope a fever above 101 F, pus (white drainage) or increased drainage or redness at the wound, or calf pain, call your surgeon's office.     Change dressing    Complete by:  As directed   You may change your dressing dressing daily with sterile 4 x 4 inch gauze dressing and paper tape.  Do not submerge the incision under water.     Constipation Prevention    Complete by:  As directed   Drink plenty of fluids.  Prune juice may be helpful.  You may use a stool softener, such as Colace (over the counter) 100 mg twice a day.  Use MiraLax (over the counter) for constipation as needed.     Diet general    Complete by:  As directed      Discharge instructions    Complete by:  As directed   Pick up stool softner and laxative for home. Do not submerge incision under water. May shower. Continue to use  ice for pain and swelling from surgery.  Total Hip Protocol.  Take Xarelto for two and a half more weeks, then discontinue Xarelto. Once the patient has completed the blood thinner regimen, then take a Baby 81 mg Aspirin daily for three more weeks.     Do not sit on low chairs, stoools or toilet seats, as it may be difficult to get up from low  surfaces    Complete by:  As directed      Driving restrictions    Complete by:  As directed   No driving until released by the physician.     Increase activity slowly as tolerated    Complete by:  As directed      Lifting restrictions    Complete by:  As directed   No lifting until released by the physician.     Patient may shower    Complete by:  As directed   You may shower without a dressing once there is no drainage.  Do not wash over the wound.  If drainage remains, do not shower until drainage stops.     TED hose    Complete by:  As directed   Use stockings (TED hose) for 3 weeks on both leg(s).  You may remove them at night for sleeping.     Weight bearing as tolerated    Complete by:  As directed             Medication List    STOP taking these medications        etodolac 500 MG tablet  Commonly known as:  LODINE      TAKE these medications        HYDROmorphone 2 MG tablet  Commonly known as:  DILAUDID  Take 1-2 tablets (2-4 mg total) by mouth every 4 (four) hours as needed for severe pain.     methocarbamol 500 MG tablet  Commonly known as:  ROBAXIN  Take 1 tablet (500 mg total) by mouth every 6 (six) hours as needed for muscle spasms.     rivaroxaban 10 MG Tabs tablet  Commonly known as:  XARELTO  - Take 1 tablet (10 mg total) by mouth daily with breakfast. Take Xarelto for three weeks, then discontinue Xarelto.  - Once the patient has completed the blood thinner regimen, then take a Baby 81 mg Aspirin daily for three more weeks.     sertraline 50 MG tablet  Commonly known as:  ZOLOFT  Take 50 mg by mouth at bedtime.     VENTOLIN HFA 108 (90 BASE) MCG/ACT inhaler  Generic drug:  albuterol  Inhale 2 puffs into the lungs every 6 (six) hours as needed for wheezing or shortness of breath.           Follow-up Information    Follow up with [facility].   Why:  home health physical therapy   Contact information:   [place]  [place] [address] [phone]       Follow up with [facility].   Why:  rolling walker and 3n1 (commode)   Contact information:   [place] [address] [phone]       Follow up with Gearlean Alf, MD. Schedule an appointment as soon as possible for a visit on 11/17/2014.   Specialty:  Orthopedic Surgery   Why:  Call office at [phone] to set up appointment on Tuesday 11/17/2014   Contact  information:   [address] [place] [address] [phone]       Signed: Arlee Muslim, PA-C Orthopaedic Surgery 11/05/2014, 9:21 AM

## 2014-11-05 NOTE — Evaluation (Signed)
Occupational Therapy Evaluation Patient Details Name: Glenn Dean MRN: [id number] DOB: [birth year] Today's Date: 11/05/2014    History of Present Illness s/p L DA THA   Clinical Impression   This 43 year old man is s/p L DA THA.  All education was completed.  Pt doesn't need any further OT at this time.  Pt would benefit from 3:1 commode.    Follow Up Recommendations  No OT follow up    Equipment Recommendations  3 in 1 bedside comode    Recommendations for Other Services       Precautions / Restrictions Precautions Precautions: None Restrictions Other Position/Activity Restrictions: WBAT      Mobility Bed Mobility                  Transfers Overall transfer level: Needs assistance Equipment used: Rolling walker (2 wheeled) Transfers: Sit to/from Stand Sit to Stand: Min guard         General transfer comment: cues for LE/UE placement    Balance                                            ADL Overall ADL's : Needs assistance/impaired     Grooming: Supervision/safety;Standing   Upper Body Bathing: Set up;Sitting   Lower Body Bathing: Minimal assistance;Sit to/from stand   Upper Body Dressing : Set up;Sitting   Lower Body Dressing: Moderate assistance;Sit to/from stand   Toilet Transfer: Min guard;Ambulation;BSC   Toileting- ArchitectClothing Manipulation and Hygiene: Min guard;Sit to/from stand         General ADL Comments: pt ambulated to bathroom and  complete ADL.  Pt felt lightheaded:  BP 125/70.  When leaving bathroom, he felt clammy and nauseas.  Sitting BP 136/74 and pt felt better.  Reviewed AE from seated position:  he can have assistance at home.  Reviewed shower, but pt did not practice.  This is upstairs, so likely, he won't have to worry about sequence stepping in.     Vision                     Perception     Praxis      Pertinent Vitals/Pain Pain Assessment: 0-10 Pain Score: 2  Pain Descriptors  / Indicators: Aching;Numbness Pain Intervention(s): Limited activity within patient's tolerance;Monitored during session;Premedicated before session;Repositioned     Hand Dominance     Extremity/Trunk Assessment Upper Extremity Assessment Upper Extremity Assessment: Overall WFL for tasks assessed           Communication Communication Communication: No difficulties   Cognition Arousal/Alertness: Awake/alert Behavior During Therapy: WFL for tasks assessed/performed Overall Cognitive Status: Within Functional Limits for tasks assessed                     General Comments       Exercises       Shoulder Instructions      Home Living Family/patient expects to be discharged to:: Private residence Living Arrangements: Spouse/significant other   Type of Home: House       Home Layout: Two level;Bed/bath upstairs;1/2 bath on main level Alternate Level Stairs-Number of Steps: 12+ Alternate Level Stairs-Rails: Left Bathroom Shower/Tub: Walk-in shower (upstairs)   Bathroom Toilet: Standard     Home Equipment: None   Additional Comments: plans to stay on main level  "for awhile"  Prior Functioning/Environment Level of Independence: Independent             OT Diagnosis: Generalized weakness   OT Problem List:     OT Treatment/Interventions:      OT Goals(Current goals can be found in the care plan section)    OT Frequency:     Barriers to D/C:            Co-evaluation              End of Session    Activity Tolerance: Other (comment) Patient left: in chair;with call bell/phone within reach   Time: 8295-62131059-1128 OT Time Calculation (min): 29 min Charges:  OT General Charges $OT Visit: 1 Procedure OT Evaluation $Initial OT Evaluation Tier I: 1 Procedure OT Treatments $Self Care/Home Management : 23-37 mins G-Codes:    Glenn Dean 11/05/2014, 11:49 AM  Glenn OtterMaryellen Wendelin Dean, OTR/L [phone] 11/05/2014

## 2014-11-05 NOTE — Progress Notes (Signed)
   11/05/14 1600  PT Visit Information  Last PT Received On 11/05/14  Assistance Needed +1  History of Present Illness s/p L DA THA  PT Time Calculation  PT Start Time (ACUTE ONLY) 1455  PT Stop Time (ACUTE ONLY) 1511  PT Time Calculation (min) (ACUTE ONLY) 16 min  Subjective Data  Patient Stated Goal less pain with amb  Precautions  Precautions None  Restrictions  Other Position/Activity Restrictions WBAT  Pain Assessment  Pain Assessment 0-10  Pain Score 5  Pain Descriptors / Indicators Aching  Pain Intervention(s) Limited activity within patient's tolerance;Monitored during session;Premedicated before session;Ice applied  Cognition  Arousal/Alertness Awake/alert  Behavior During Therapy WFL for tasks assessed/performed  Overall Cognitive Status Within Functional Limits for tasks assessed  Bed Mobility  Overal bed mobility Needs Assistance  Bed Mobility Sit to Supine  Sit to supine Min assist  General bed mobility comments with LLE, cues for technique  Transfers  Overall transfer level Needs assistance  Equipment used Rolling walker (2 wheeled)  Transfers Sit to/from Stand  Sit to Stand Supervision  General transfer comment cues for LE/UE placement  Ambulation/Gait  Ambulation/Gait assistance Supervision  Ambulation Distance (Feet) 140 Feet  Assistive device Rolling walker (2 wheeled)  Gait Pattern/deviations Step-to pattern;Step-through pattern  General Gait Details cues for step through,posture  Total Joint Exercises  Ankle Circles/Pumps AROM;Both;10 reps  Quad Sets Strengthening;Both;10 reps  Heel Slides AAROM;AROM;Left;10 reps  PT - End of Session  Activity Tolerance Patient tolerated treatment well  Patient left in bed;with call bell/phone within reach;with family/visitor present  Nurse Communication Mobility status  PT - Assessment/Plan  PT Plan Current plan remains appropriate  PT Frequency (ACUTE ONLY) 7X/week  Follow Up Recommendations Home health PT   PT equipment Rolling walker with 5" wheels  PT Goal Progression  Progress towards PT goals Progressing toward goals  Acute Rehab PT Goals  PT Goal Formulation With patient  Time For Goal Achievement 11/07/14  Potential to Achieve Goals Good  PT General Charges  $$ ACUTE PT VISIT 1 Procedure  PT Treatments  $Gait Training 8-22 mins

## 2014-11-05 NOTE — Care Management Note (Signed)
UR completed 

## 2014-11-05 NOTE — Evaluation (Signed)
Physical Therapy Evaluation Patient Details Name: Glenn Dean MRN: [id number] DOB: [birth year] Today's Date: 11/05/2014   History of Present Illness  s/p L DA THA  Clinical Impression  Pt will benefit form PT to address deficits below; plan is for home with family support, will need RW and possible crutches    Follow Up Recommendations Home health PT    Equipment Recommendations  Rolling walker with 5" wheels    Recommendations for Other Services       Precautions / Restrictions Precautions Precautions: None Restrictions Other Position/Activity Restrictions: WBAT      Mobility  Bed Mobility Overal bed mobility: Needs Assistance Bed Mobility: Supine to Sit     Supine to sit: Min assist     General bed mobility comments: with LLE, cues for technique  Transfers Overall transfer level: Needs assistance Equipment used: Rolling walker (2 wheeled) Transfers: Sit to/from Stand Sit to Stand: Min guard         General transfer comment: cues for LE/UE placement  Ambulation/Gait Ambulation/Gait assistance: Min guard;Supervision Ambulation Distance (Feet): 100 Feet Assistive device: Rolling walker (2 wheeled) Gait Pattern/deviations: Step-to pattern   Gait velocity interpretation: Below normal speed for age/gender General Gait Details: cues for sequencing  Stairs            Wheelchair Mobility    Modified Rankin (Stroke Patients Only)       Balance                                             Pertinent Vitals/Pain Pain Assessment: 0-10 Pain Score: 2  Pain Descriptors / Indicators: Aching;Numbness Pain Intervention(s): Limited activity within patient's tolerance;Monitored during session;Ice applied;Premedicated before session    Home Living Family/patient expects to be discharged to:: Private residence Living Arrangements: Spouse/significant other   Type of Home: House       Home Layout: Two level;Bed/bath  upstairs;1/2 bath on main level Home Equipment: None Additional Comments: plans to stay on main level  "for awhile"    Prior Function Level of Independence: Independent               Hand Dominance        Extremity/Trunk Assessment   Upper Extremity Assessment: Defer to OT evaluation;Overall WFL for tasks assessed           Lower Extremity Assessment: LLE deficits/detail   LLE Deficits / Details: grossly 3/5 at hip; knee and ankle WFL for activities tested     Communication   Communication: No difficulties  Cognition Arousal/Alertness: Awake/alert Behavior During Therapy: WFL for tasks assessed/performed Overall Cognitive Status: Within Functional Limits for tasks assessed                      General Comments      Exercises Total Joint Exercises Ankle Circles/Pumps: AROM;Both;10 reps Quad Sets: 5 reps;Both;Strengthening      Assessment/Plan    PT Assessment Patient needs continued PT services  PT Diagnosis Difficulty walking   PT Problem List Decreased activity tolerance;Decreased strength;Decreased mobility;Decreased knowledge of use of DME  PT Treatment Interventions Functional mobility training;Gait training;DME instruction;Stair training;Therapeutic exercise;Patient/family education   PT Goals (Current goals can be found in the Care Plan section) Acute Rehab PT Goals Patient Stated Goal: less pain with amb PT Goal Formulation: With patient Time For Goal Achievement: 11/07/14 Potential to Achieve  Goals: Good    Frequency 7X/week   Barriers to discharge        Co-evaluation               End of Session   Activity Tolerance: Patient tolerated treatment well Patient left: in chair;with call bell/phone within reach Nurse Communication: Mobility status         Time: [id number] PT Time Calculation (min) (ACUTE ONLY): 30 min   Charges:   PT Evaluation $Initial PT Evaluation Tier I: 1 Procedure PT Treatments $Gait  Training: 23-37 mins   PT G Codes:          Glenn Dean 11/05/2014, 1:29 PM

## 2014-11-06 LAB — CBC
HCT: 35.7 % — ABNORMAL LOW (ref 39.0–52.0)
Hemoglobin: 12.3 g/dL — ABNORMAL LOW (ref 13.0–17.0)
MCH: 31.9 pg (ref 26.0–34.0)
MCHC: 34.5 g/dL (ref 30.0–36.0)
MCV: 92.7 fL (ref 78.0–100.0)
Platelets: 174 10*3/uL (ref 150–400)
RBC: 3.85 MIL/uL — ABNORMAL LOW (ref 4.22–5.81)
RDW: 13.2 % (ref 11.5–15.5)
WBC: 11.4 10*3/uL — ABNORMAL HIGH (ref 4.0–10.5)

## 2014-11-06 LAB — BASIC METABOLIC PANEL
ANION GAP: 11 (ref 5–15)
BUN: 7 mg/dL (ref 6–23)
CALCIUM: 9.1 mg/dL (ref 8.4–10.5)
CO2: 26 mEq/L (ref 19–32)
CREATININE: 0.95 mg/dL (ref 0.50–1.35)
Chloride: 102 mEq/L (ref 96–112)
GFR calc non Af Amer: >90 mL/min (ref >90)
Glucose, Bld: 125 mg/dL — ABNORMAL HIGH (ref 70–99)
Potassium: 3.9 mEq/L (ref 3.7–5.3)
Sodium: 139 mEq/L (ref 137–147)

## 2014-11-06 NOTE — Progress Notes (Signed)
   Subjective: 2 Days Post-Op Procedure(s) (LRB): LEFT TOTAL HIP ARTHROPLASTY ANTERIOR APPROACH (Left) Patient reports pain as mild and moderate.   Patient seen in rounds with Dr. Lequita HaltAluisio. Patient is well, but has had some minor complaints of pain in the hip, requiring pain medications Patient is ready to go home.  Objective: Vital signs in last 24 hours: Temp:  [98 F (36.7 C)-99.4 F (37.4 C)] 99 F (37.2 C) (11/13 0614) Pulse Rate:  [83-113] 113 (11/13 0614) Resp:  [16] 16 (11/13 0614) BP: (125-143)/(67-73) 134/72 mmHg (11/13 0614) SpO2:  [100 %] 100 % (11/13 0614)  Intake/Output from previous day:  Intake/Output Summary (Last 24 hours) at 11/06/14 0716 Last data filed at 11/06/14 0615  Gross per 24 hour  Intake    960 ml  Output   2425 ml  Net  -1465 ml    Intake/Output this shift:    Labs:  Recent Labs  11/05/14 0431 11/06/14 0525  HGB 11.9* 12.3*    Recent Labs  11/05/14 0431 11/06/14 0525  WBC 9.7 11.4*  RBC 3.76* 3.85*  HCT 35.0* 35.7*  PLT 192 174    Recent Labs  11/05/14 0431 11/06/14 0525  NA 136* 139  K 4.0 3.9  CL 101 102  CO2 25 26  BUN 10 7  CREATININE 1.00 0.95  GLUCOSE 157* 125*  CALCIUM 8.9 9.1   No results for input(s): LABPT, INR in the last 72 hours.  EXAM: General - Patient is Alert, Appropriate and Oriented Extremity - Neurovascular intact Sensation intact distally Incision - clean, dry, no drainage Motor Function - intact, moving foot and toes well on exam.   Assessment/Plan: 2 Days Post-Op Procedure(s) (LRB): LEFT TOTAL HIP ARTHROPLASTY ANTERIOR APPROACH (Left) Procedure(s) (LRB): LEFT TOTAL HIP ARTHROPLASTY ANTERIOR APPROACH (Left) Past Medical History  Diagnosis Date  . Depression   . Arthritis    Principal Problem:   Primary osteoarthritis of left hip  Estimated body mass index is 21.85 kg/(m^2) as calculated from the following:   Height as of this encounter: 5\' 9"  (1.753 m).   Weight as of this  encounter: 67.132 kg (148 lb). Up with therapy Discharge home with home health Diet - Regular diet Follow up - in 2 weeks on Tuesday 11/17/2013 Activity - WBAT Disposition - Home Condition Upon Discharge - Good D/C Meds - See DC Summary DVT Prophylaxis - Xarelto  Avel Peacerew Melody Savidge, PA-C Orthopaedic Surgery 11/06/2014, 7:16 AM

## 2014-11-06 NOTE — Progress Notes (Signed)
Physical Therapy Treatment Patient Details Name: Elie GoodyHarold J Casady MRN: [id number] DOB: 24-Jul-1971 Today's Date: 11/06/2014    History of Present Illness s/p L DA THA    PT Comments    Pt has progressed well  Follow Up Recommendations  Home health PT     Equipment Recommendations  Rolling walker with 5" wheels;Crutches    Recommendations for Other Services       Precautions / Restrictions Precautions Precautions: None Restrictions Weight Bearing Restrictions: No Other Position/Activity Restrictions: WBAT    Mobility  Bed Mobility Overal bed mobility: Needs Assistance Bed Mobility: Supine to Sit;Sit to Supine     Supine to sit: Min guard;Supervision Sit to supine: Supervision;Min guard   General bed mobility comments: with LLE, cues for technique, incr time  Transfers Overall transfer level: Needs assistance Equipment used: Rolling walker (2 wheeled) Transfers: Sit to/from Stand Sit to Stand: Supervision;Modified independent (Device/Increase time)         General transfer comment: pt self corrects for hand placement  Ambulation/Gait Ambulation/Gait assistance: Supervision;Modified independent (Device/Increase time) Ambulation Distance (Feet): 160 Feet Assistive device: Rolling walker (2 wheeled);Crutches Gait Pattern/deviations: Step-through pattern;Step-to pattern     General Gait Details: cues for step through,posture   Stairs Stairs: Yes Stairs assistance: Min guard Stair Management: One rail Left;With crutches;Forwards;Step to pattern Number of Stairs: 3 General stair comments: cues for sequence  Wheelchair Mobility    Modified Rankin (Stroke Patients Only)       Balance                                    Cognition Arousal/Alertness: Awake/alert Behavior During Therapy: WFL for tasks assessed/performed Overall Cognitive Status: Within Functional Limits for tasks assessed                      Exercises  Total Joint Exercises Ankle Circles/Pumps: AROM;Both;10 reps Quad Sets: Limitations Quad Sets Limitations: pt reports too sore form doing on his own Heel Slides: AAROM;AROM;Left;10 reps;Supine Hip ABduction/ADduction: AAROM;AROM;Left;10 reps;Supine Knee Flexion: AROM;Left;10 reps;Standing Marching in Standing: AROM;Left;10 reps;Standing    General Comments        Pertinent Vitals/Pain Pain Assessment: No/denies pain Pain Location: soreness with ex's only Pain Intervention(s): RN gave pain meds during session;Ice applied    Home Living                      Prior Function            PT Goals (current goals can now be found in the care plan section) Acute Rehab PT Goals Patient Stated Goal: less pain with amb PT Goal Formulation: With patient Time For Goal Achievement: 11/07/14 Potential to Achieve Goals: Good Progress towards PT goals: Progressing toward goals    Frequency  7X/week    PT Plan Current plan remains appropriate    Co-evaluation             End of Session   Activity Tolerance: Patient tolerated treatment well Patient left: in bed;with call bell/phone within reach     Time: 0942-1012 PT Time Calculation (min) (ACUTE ONLY): 30 min  Charges:  $Gait Training: 8-22 mins $Therapeutic Exercise: 8-22 mins                    G Codes:      Ajit Errico 11/06/2014, 10:40 AM

## 2015-08-02 ENCOUNTER — Encounter: Payer: Self-pay | Admitting: Pulmonary Disease

## 2015-08-02 ENCOUNTER — Ambulatory Visit (INDEPENDENT_AMBULATORY_CARE_PROVIDER_SITE_OTHER)
Admission: RE | Admit: 2015-08-02 | Discharge: 2015-08-02 | Disposition: A | Discharge status: Home / Self Care | Payer: BLUE CROSS/BLUE SHIELD | Source: Ambulatory Visit | Attending: Pulmonary Disease | Admitting: Pulmonary Disease

## 2015-08-02 ENCOUNTER — Ambulatory Visit (INDEPENDENT_AMBULATORY_CARE_PROVIDER_SITE_OTHER): Payer: BLUE CROSS/BLUE SHIELD | Admitting: Pulmonary Disease

## 2015-08-02 VITALS — BP 114/66 | HR 60 | Ht 69.0 in | Wt 147.8 lb

## 2015-08-02 DIAGNOSIS — R062 Wheezing: Secondary | ICD-10-CM | POA: Diagnosis not present

## 2015-08-02 DIAGNOSIS — R059 Cough, unspecified: Secondary | ICD-10-CM | POA: Insufficient documentation

## 2015-08-02 DIAGNOSIS — R05 Cough: Secondary | ICD-10-CM

## 2015-08-02 DIAGNOSIS — F4310 Post-traumatic stress disorder, unspecified: Secondary | ICD-10-CM | POA: Insufficient documentation

## 2015-08-02 DIAGNOSIS — G4700 Insomnia, unspecified: Secondary | ICD-10-CM | POA: Insufficient documentation

## 2015-08-02 NOTE — Patient Instructions (Signed)
1. We're checking breathing test to determine if you have COPD 2. You can continue using her albuterol in your inhaler and nebulizer as needed for your cough or wheezing 3. I'm checking a chest x-ray today to determine whether or not there is some underlying abnormality to your lungs that could be contributing to your cough 4. Please pay attention to determine whether or not she have heartburn that she may not be appreciating 5. Please try to quit smoking totally. He can use nicotine patches on a daily basis with, or lozenges intermittently to help for cravings. 6. You will return to clinic in 1-2 months but please contact my office if you have any further questions or concerns.

## 2015-08-02 NOTE — Progress Notes (Signed)
Subjective:    Patient ID: Glenn Dean, male    DOB: September 19, 1971, 44 y.o.   MRN: [id number]  HPI He reports he started to notice coughing after his hip surgery in November 2015. He noticed it first after attempt to smoke.  He reports his cough seemed to get worse.  He has cut back on his cigarette use which has also helped with his dyspnea as well as his cough. He denies any significant cough or dyspnea after using his vapor cigarette. He He reports he also had wheezing but this too seems to have improved. He reports this last week he had an intermittent cough productive of a clear or "light yellow" mucus. He denies any hemoptysis. He reports last week he had chest pressure lasting "all night" in his anterior chest that felt like someone "putting their foot" on his chest. He also had wheezing. He took some Tums and they seemed to help. He denies any reflux, dyspepsia, or morning brash water taste. No dysphagia or odynophagia. He denies any recent or prior sinus congestion or drainage. He denies any breathing problems as a child. Never diagnosed with asthma. He was prescribed an albuterol inhaler & nebulizer from the [place] and reports it did seem to help his cough. He denies any history of bronchitis or pneumonia. He does notice that increased heat, especially where he sleeps, causes his cough. He denies any joint swelling or erythema. He has some mild joint stiffness in the morning lasting for an hour at most.   Review of Systems No rashes or bruising. No dysuria, hematuria, or urinary hesitancy. A pertinent 14 point review of systems is negative except as per the history of presenting illness.  Allergies  Allergen Reactions  . Norco [Hydrocodone-Acetaminophen] Itching    Nightmares.    Current Outpatient Prescriptions on File Prior to Visit  Medication Sig Dispense Refill  . albuterol (VENTOLIN HFA) 108 (90 BASE) MCG/ACT inhaler Inhale 2 puffs into the lungs every 6 (six) hours as needed for  wheezing or shortness of breath.    . sertraline (ZOLOFT) 50 MG tablet Take 50 mg by mouth at bedtime.     No current facility-administered medications on file prior to visit.   Past Medical History  Diagnosis Date  . Depression   . Arthritis   . H/O spinal cord injury   . PTSD (post-traumatic stress disorder)    Past Surgical History  Procedure Laterality Date  . Total hip arthroplasty Left 11/04/2014    Procedure: LEFT TOTAL HIP ARTHROPLASTY ANTERIOR APPROACH;  Surgeon: Loanne Drilling, MD;  Location: [facility];  Service: Orthopedics;  Laterality: Left;   Family History  Problem Relation Age of Onset  . Diabetes Mother   . Hypertension Mother   . Diabetes Father   . Hypertension Father   . Congestive Heart Failure Maternal Grandmother   . Rheumatologic disease Neg Hx   . Asthma Son   . Allergies Son   . Allergies Daughter     History   Social History  . Marital Status: Married    Spouse Name: N/A  . Number of Children: N/A  . Years of Education: N/A   Occupational History  . bus operator    Social History Main Topics  . Smoking status: Current Every Day Smoker -- 0.25 packs/day for 25 years    Types: Cigarettes  . Smokeless tobacco: Never Used     Comment: uses vape and smokes about 2 packs per week//08/02/15  .  Alcohol Use: 4.8 oz/week    8 Cans of beer per week  . Drug Use: No  . Sexual Activity: Not on file   Other Topics Concern  . None   Social History Narrative       Objective:   Physical Exam Blood pressure 114/66, pulse 60, height 5\' 9"  (1.753 m), weight 147 lb 12.8 oz (67.042 kg), SpO2 99 %. General:  Awake. Alert. No acute distress. Integument:  Warm & dry. No rash on exposed skin. No bruising. Lymphatics:  No appreciated cervical or supraclavicular lymphadenoapthy. HEENT:  Moist mucus membranes. No oral ulcers. No scleral injection or icterus. Mild bilateral nasal turbinate swelling. PERRL. Cardiovascular:  Regular rate. No edema. No  appreciable JVD.  Pulmonary:  Good aeration & clear to auscultation bilaterally. Symmetric chest wall expansion. No accessory muscle use. Abdomen: Soft. Normal bowel sounds. Nondistended. Grossly nontender. Musculoskeletal:  Normal bulk and tone. Hand grip strength 5/5 bilaterally. No joint deformity or effusion appreciated. Neurological:  CN 2-12 grossly in tact. No meningismus. Moving all 4 extremities equally. Symmetric patellar deep tendon reflexes. Psychiatric:  Mood and affect congruent. Speech normal rhythm, rate & tone.     Assessment & Plan:  44 year old male with ongoing tobacco use & history of cough as well as wheezing since November 2015. Patient also has associated dyspnea. He does have symptomatic relief with use of bronchodilator therapy. Suspect underlying COPD and I did communicate this to the patient. I spent a significant amount time today discussing the pathophysiology of COPD and the need for complete tobacco cessation. I spent over 3 minutes counseling the patient on different techniques for tobacco cessation as well as the need for complete abstinence. He'll contact my office for any further questions or concerns.  1. Wheezing: Checking full PFT with bronchodilator challenge. This will be discussed at his follow-up appointment. 2. Cough: Checking chest x-ray PA/LAT today. 3. Ongoing tobacco use: Spelled over 3 minutes counseling the patient will need to completely quit smoking tobacco. He is going to try nicotine patches with gum/lozenges for mentally for cravings. His wife also smokes and will be attempted to quit using the same technique. 4. Follow-up: Patient to return to clinic 1-2 months or sooner if needed.

## 2015-08-05 ENCOUNTER — Telehealth: Payer: Self-pay | Admitting: Pulmonary Disease

## 2015-08-05 NOTE — Telephone Encounter (Signed)
Pt informed of cxr results per Dr Jamison Neighbor.

## 2015-09-03 ENCOUNTER — Other Ambulatory Visit: Payer: BLUE CROSS/BLUE SHIELD

## 2015-09-03 ENCOUNTER — Encounter: Payer: Self-pay | Admitting: Pulmonary Disease

## 2015-09-03 ENCOUNTER — Ambulatory Visit (INDEPENDENT_AMBULATORY_CARE_PROVIDER_SITE_OTHER): Payer: BLUE CROSS/BLUE SHIELD | Admitting: Pulmonary Disease

## 2015-09-03 VITALS — BP 122/64 | HR 63 | Ht 70.0 in | Wt 148.0 lb

## 2015-09-03 DIAGNOSIS — J449 Chronic obstructive pulmonary disease, unspecified: Secondary | ICD-10-CM | POA: Diagnosis not present

## 2015-09-03 DIAGNOSIS — R05 Cough: Secondary | ICD-10-CM

## 2015-09-03 DIAGNOSIS — R062 Wheezing: Secondary | ICD-10-CM | POA: Diagnosis not present

## 2015-09-03 DIAGNOSIS — Z720 Tobacco use: Secondary | ICD-10-CM | POA: Diagnosis not present

## 2015-09-03 DIAGNOSIS — R059 Cough, unspecified: Secondary | ICD-10-CM

## 2015-09-03 DIAGNOSIS — F17200 Nicotine dependence, unspecified, uncomplicated: Secondary | ICD-10-CM

## 2015-09-03 LAB — PULMONARY FUNCTION TEST
DL/VA % PRED: 92 %
DL/VA: 4.32 ml/min/mmHg/L
DLCO unc % pred: 79 %
DLCO unc: 25.79 ml/min/mmHg
FEF 25-75 POST: 2.25 L/s
FEF 25-75 PRE: 1.34 L/s
FEF2575-%Change-Post: 67 %
FEF2575-%PRED-PRE: 38 %
FEF2575-%Pred-Post: 64 %
FEV1-%Change-Post: 18 %
FEV1-%Pred-Post: 89 %
FEV1-%Pred-Pre: 75 %
FEV1-PRE: 2.6 L
FEV1-Post: 3.08 L
FEV1FVC-%CHANGE-POST: 15 %
FEV1FVC-%PRED-PRE: 75 %
FEV6-%Change-Post: 5 %
FEV6-%Pred-Post: 104 %
FEV6-%Pred-Pre: 98 %
FEV6-POST: 4.34 L
FEV6-Pre: 4.12 L
FEV6FVC-%Change-Post: 1 %
FEV6FVC-%PRED-POST: 102 %
FEV6FVC-%Pred-Pre: 100 %
FVC-%Change-Post: 2 %
FVC-%PRED-PRE: 99 %
FVC-%Pred-Post: 102 %
FVC-POST: 4.36 L
FVC-Pre: 4.23 L
PRE FEV1/FVC RATIO: 61 %
Post FEV1/FVC ratio: 71 %
Post FEV6/FVC ratio: 100 %
Pre FEV6/FVC Ratio: 98 %
RV % pred: 92 %
RV: 1.81 L
TLC % pred: 90 %
TLC: 6.31 L

## 2015-09-03 NOTE — Progress Notes (Signed)
PFT done today. 

## 2015-09-03 NOTE — Patient Instructions (Signed)
1. 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 2. We are checking you for the genetic form of COPD. 3. Please call my office if you want to try using Spiriva to help your breathing. 4. I recommend getting the influenza vaccine this year in October. 5. I will see you back in 3 months but contact me if your breathing worsens or you have any new problems.

## 2015-09-03 NOTE — Progress Notes (Signed)
Subjective:    Patient ID: Glenn Dean, male    DOB: 16-Feb-1971, 44 y.o.   MRN: [id number]  C.C.:  Follow-up for Cough, Mild COPD, & Ongoing Tobacco Use.  HPI Cough: Likely secondary to his underlying COPD & tobacco use. No evidence of parenchymal abnormality on chest x-ray imaging. Reports his cough is maybe slightly improved.  Mild COPD: Newly found on spirometry today. Patient does have a significant bronchodilator response. Reports he is still having some coughing & wheezing at night. Cough is only rarely productive of a mucus that was clear mucus. He is using his rescue inhaler 3-4 times daily. Continues to have dyspnea on exertion but reports this is not above his baseline.   Ongoing tobacco use: Patient to attempt to try to quit using nicotine gum/lozenges at last appointment. Still having trouble quitting. His wife is also continuing to smoke. He did give all his cigarettes to his wife on Wednesday.  Review of Systems No chest pain or pressure. No fever, chills, or sweats. No sinus congestion, pressure, or pain.  Allergies  Allergen Reactions  . Norco [Hydrocodone-Acetaminophen] Itching    Nightmares.    Current Outpatient Prescriptions on File Prior to Visit  Medication Sig Dispense Refill  . albuterol (VENTOLIN HFA) 108 (90 BASE) MCG/ACT inhaler Inhale 2 puffs into the lungs every 6 (six) hours as needed for wheezing or shortness of breath.    . meloxicam (MOBIC) 15 MG tablet Take 15 mg by mouth as needed for pain.     No current facility-administered medications on file prior to visit.   Past Medical History  Diagnosis Date  . Depression   . Arthritis   . H/O spinal cord injury   . PTSD (post-traumatic stress disorder)    Past Surgical History  Procedure Laterality Date  . Total hip arthroplasty Left 11/04/2014    Procedure: LEFT TOTAL HIP ARTHROPLASTY ANTERIOR APPROACH;  Surgeon: Loanne Drilling, MD;  Location: [facility];  Service: Orthopedics;  Laterality:  Left;   Family History  Problem Relation Age of Onset  . Diabetes Mother   . Hypertension Mother   . Diabetes Father   . Hypertension Father   . Congestive Heart Failure Maternal Grandmother   . Rheumatologic disease Neg Hx   . Asthma Son   . Allergies Son   . Allergies Daughter     Social History   Social History  . Marital Status: Married    Spouse Name: N/A  . Number of Children: N/A  . Years of Education: N/A   Occupational History  . bus operator    Social History Main Topics  . Smoking status: Current Every Day Smoker -- 0.25 packs/day for 25 years    Types: Cigarettes    Start date: 12/25/1988  . Smokeless tobacco: Never Used     Comment: uses vape & peak rate of 1/2ppd  . Alcohol Use: 4.8 oz/week    8 Cans of beer per week     Comment: per week  . Drug Use: No  . Sexual Activity: Not Asked   Other Topics Concern  . None   Social History Narrative   Originally from [place]. He has traveled to [place], [place], [place], [place], [place], [place], [place], [place], [place], [place], [place], & [place]. He served in [employer] and has been to [place] & [place], [place], [place], [place], [place], & [place]. 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 He has a dog at home. No bird, mold, or hot tub exposure. Enjoys fishing and shoot firearms.       Objective:   Physical Exam Blood pressure 122/64, pulse 63, height  (1.778 m), weight 148 lb (67.132 kg), SpO2 98 %. General:  Awake. Alert. Thin, African-American male. Integument:  Warm & dry. No rash on exposed skin.  Lymphatics:  No appreciated cervical or supraclavicular lymphadenoapthy. HEENT:  Moist mucus membranes. No oral ulcers. No scleral icterus. Cardiovascular:  Regular rate. No edema. No appreciable JVD.  Pulmonary:  Good aeration & clear to auscultation bilaterally. Symmetric chest wall expansion. No  accessory muscle use on room air. Abdomen: Soft. Normal bowel sounds. Nondistended.   PFT 09/03/15: FVC 2.23 L (99%) FEV1 2.60 L (75%) FEV1/FVC 0.61 FEF 25-75 1.34 L (38%) positive bronchodilator response TLC 6.31 L (90%) RV 92% ERV 144% DLCO uncorrected 79%  IMAGING CXR PA/LAT 08/02/15 (personally reviewed by me): Mild hyperinflation with flattening of the diaphragms and deep sulci bilaterally. Heart normal in size. It is normal in contour. No parenchymal opacity or mass appreciated. No pleural effusion or thickening appreciated.    Assessment & Plan:  44 year old male with ongoing tobacco use. His spirometry today does show mild airways obstruction with a significant bronchodilator response. We discussed testing him for alpha-1 antitrypsin deficiency today. We also discussed starting him on Spiriva to help decrease his use of his rescue inhaler and improved symptom control. The patient wishes to wait until he has quit smoking to see if this has an effect on his symptoms. We also discussed his need for immunizations and avoiding patients with respiratory and GI illnesses. The patient reports he did weigh all of his cigarettes on Wednesday and is using this as a means of smoking cessation. However, his wife does continue to smoke tobacco. I instructed the patient to contact my office if he wished to try Spiriva or had any further questions or concerns.  1. Mild COPD: Patient continuing to use albuterol inhaler as needed. He will contact my office if he wishes to try using Spiriva. Repeat spirometry with bronchodilator challenge and next appointment. Screening for alpha-1 antitrypsin deficiency. 2. Ongoing tobacco use: Counseled the patient for over 3 minutes and need for complete tobacco cessation. Wife continues to smoke. 3. Follow-up: Patient to return to clinic in 3 months or sooner if needed.

## 2015-09-09 LAB — ALPHA-1 ANTITRYPSIN PHENOTYPE: A-1 Antitrypsin: 139 mg/dL (ref 83–199)

## 2015-09-17 ENCOUNTER — Telehealth: Payer: Self-pay | Admitting: Pulmonary Disease

## 2015-09-17 NOTE — Telephone Encounter (Signed)
Called spoke with pt. He reports he is now ready to start spiriva. Do you want the handihaler or respimat? Please advise thanks

## 2015-09-20 NOTE — Telephone Encounter (Signed)
lmtcb X1 for pt. Need to verify pharmacy before sending in rx.

## 2015-09-20 NOTE — Telephone Encounter (Signed)
Let's do the Respimat 2 inhalations once daily and give him 3 refills. Thanks.

## 2015-09-20 NOTE — Telephone Encounter (Signed)
lmtcb x1 for pt. 

## 2015-09-21 MED ORDER — TIOTROPIUM BROMIDE MONOHYDRATE 2.5 MCG/ACT IN AERS: 2.0000 | INHALATION_SPRAY | Freq: Every day | RESPIRATORY_TRACT | Status: DC

## 2015-09-21 NOTE — Telephone Encounter (Signed)
Spoke with pt and informed that rx for Spiriva was sent to [facility] [address]

## 2015-09-21 NOTE — Telephone Encounter (Signed)
[phone], pt cb

## 2016-01-05 ENCOUNTER — Ambulatory Visit: Payer: BLUE CROSS/BLUE SHIELD | Admitting: Pulmonary Disease

## 2016-01-06 ENCOUNTER — Telehealth: Payer: Self-pay | Admitting: Pulmonary Disease

## 2016-01-06 NOTE — Telephone Encounter (Signed)
Pt was given neb machine and albuterol neb through [facility]. He is requesting refill on this. Please advise thanks

## 2016-01-07 MED ORDER — ALBUTEROL SULFATE 1.25 MG/3ML IN NEBU: 1.0000 | INHALATION_SOLUTION | Freq: Four times a day (QID) | RESPIRATORY_TRACT | Status: DC | PRN

## 2016-01-07 NOTE — Telephone Encounter (Signed)
Spoke with pt. Advised him that we will send in this prescription. Nothing further was needed.

## 2016-02-24 ENCOUNTER — Encounter: Payer: Self-pay | Admitting: Pulmonary Disease

## 2016-02-24 ENCOUNTER — Ambulatory Visit (INDEPENDENT_AMBULATORY_CARE_PROVIDER_SITE_OTHER): Payer: BLUE CROSS/BLUE SHIELD | Admitting: Pulmonary Disease

## 2016-02-24 VITALS — BP 124/76 | HR 75 | Ht 70.0 in | Wt 152.6 lb

## 2016-02-24 DIAGNOSIS — J449 Chronic obstructive pulmonary disease, unspecified: Secondary | ICD-10-CM

## 2016-02-24 DIAGNOSIS — F17200 Nicotine dependence, unspecified, uncomplicated: Secondary | ICD-10-CM

## 2016-02-24 LAB — PULMONARY FUNCTION TEST
FEF 25-75 POST: 2.36 L/s
FEF 25-75 Pre: 2.56 L/sec
FEF2575-%Change-Post: -7 %
FEF2575-%PRED-POST: 68 %
FEF2575-%PRED-PRE: 73 %
FEV1-%Change-Post: -2 %
FEV1-%Pred-Post: 96 %
FEV1-%Pred-Pre: 99 %
FEV1-POST: 3.32 L
FEV1-Pre: 3.4 L
FEV1FVC-%Change-Post: -1 %
FEV1FVC-%PRED-PRE: 91 %
FEV6-%CHANGE-POST: -1 %
FEV6-%Pred-Post: 108 %
FEV6-%Pred-Pre: 109 %
FEV6-Post: 4.51 L
FEV6-Pre: 4.56 L
FEV6FVC-%CHANGE-POST: 0 %
FEV6FVC-%Pred-Post: 102 %
FEV6FVC-%Pred-Pre: 102 %
FVC-%CHANGE-POST: -1 %
FVC-%Pred-Post: 105 %
FVC-%Pred-Pre: 107 %
FVC-Post: 4.51 L
FVC-Pre: 4.57 L
POST FEV1/FVC RATIO: 74 %
PRE FEV1/FVC RATIO: 74 %
Post FEV6/FVC ratio: 100 %
Pre FEV6/FVC Ratio: 100 %

## 2016-02-24 MED ORDER — TIOTROPIUM BROMIDE MONOHYDRATE 2.5 MCG/ACT IN AERS
2.0000 | INHALATION_SPRAY | Freq: Every day | RESPIRATORY_TRACT | Status: DC
Start: 2016-02-24 — End: 2017-09-14

## 2016-02-24 NOTE — Progress Notes (Signed)
Subjective:    Patient ID: Glenn Dean, male    DOB: [birth year], 45 y.o.   MRN: [id number]  C.C.:  Follow-up for Mild COPD, & Ongoing Tobacco Use.  HPI Mild COPD: Patient started on Spiriva after last appointment. He continues to have some wheezing that is intermittent. He uses his rescue inhaler a couple of times daily feeling it does help. Reports he is compliant with Spiriva. He denies any significant worsening in his breathing with the increasing pollen count.   Ongoing tobacco use: He is still using a vapor cigarette. He is still smoking 3 cigarettes a week at most. He reports he no longer likes the taste of tobacco. His wife has continued to smoke around.   Review of Systems   Allergies  Allergen Reactions  . Norco [Hydrocodone-Acetaminophen] Itching    Nightmares.    Current Outpatient Prescriptions on File Prior to Visit  Medication Sig Dispense Refill  . albuterol (ACCUNEB) 1.25 MG/3ML nebulizer solution Take 3 mLs (1.25 mg total) by nebulization every 6 (six) hours as needed for wheezing. 150 mL 11  . albuterol (VENTOLIN HFA) 108 (90 BASE) MCG/ACT inhaler Inhale 2 puffs into the lungs every 6 (six) hours as needed for wheezing or shortness of breath.    . meloxicam (MOBIC) 15 MG tablet Take 15 mg by mouth as needed for pain.    . Tiotropium Bromide Monohydrate (SPIRIVA RESPIMAT) 2.5 MCG/ACT AERS Inhale 2 puffs into the lungs daily. 1 Inhaler 3   No current facility-administered medications on file prior to visit.   Past Medical History  Diagnosis Date  . Depression   . Arthritis   . H/O spinal cord injury   . PTSD (post-traumatic stress disorder)    Past Surgical History  Procedure Laterality Date  . Total hip arthroplasty Left 11/04/2014    Procedure: LEFT TOTAL HIP ARTHROPLASTY ANTERIOR APPROACH;  Surgeon: Loanne Drilling, MD;  Location: [facility];  Service: Orthopedics;  Laterality: Left;   Family History  Problem Relation Age of Onset  . Diabetes Mother     . Hypertension Mother   . Diabetes Father   . Hypertension Father   . Congestive Heart Failure Maternal Grandmother   . Rheumatologic disease Neg Hx   . Asthma Son   . Allergies Son   . Allergies Daughter     Social History   Social History  . Marital Status: Married    Spouse Name: N/A  . Number of Children: N/A  . Years of Education: N/A   Occupational History  . bus operator    Social History Main Topics  . Smoking status: Current Every Day Smoker -- 0.25 packs/day for 25 years    Types: Cigarettes    Start date: 12/25/1988  . Smokeless tobacco: Never Used     Comment: uses vape & peak rate of 1/2ppd  . Alcohol Use: 4.8 oz/week    8 Cans of beer per week     Comment: per week  . Drug Use: No  . Sexual Activity: Not Asked   Other Topics Concern  . None   Social History Narrative   Originally from [place]. He has traveled to [place], [place], [place], [place], [place], [place], [place], [place], [place], [place], [place], & [place]. He served in [employer] and has been to [place], [place], [place], [place], [place], & [place]. 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 He has a dog at home. No bird, mold, or hot tub exposure. Enjoys fishing and shoot firearms.       Objective:   Physical Exam BP 124/76 mmHg  Pulse 75  Ht  (1.778 m)  Wt 152 lb 9.6 oz (69.219 kg)  BMI 21.90 kg/m2  SpO2 99% General:  Awake. Alert. No distress. Integument:  Warm & dry. No rash on exposed skin.  Lymphatics:  No appreciated cervical or supraclavicular lymphadenoapthy. HEENT:  Moist mucus membranes. No oral ulcers. Mild bilateral nasal turbinate swelling. Cardiovascular:  Regular rate. No edema. No appreciable JVD.  Pulmonary:  Clear bilaterally to auscultation. Symmetric chest wall expansion. Normal work of breathing on room air. Abdomen: Soft. Normal bowel sounds.  Nondistended.   PFT 02/24/16: FVC 4.57 L (107%) FEV1 3.40 L (99%) FEV1/FVC 0.74 FEF 25-35 2.56 L (73%) negative bronchodilator response 09/03/15: FVC 4.23 L (99%) FEV1 2.60 L (75%) FEV1/FVC 0.61 FEF 25-75 1.34 L (38%) positive bronchodilator response TLC 6.31 L (90%) RV 92% ERV 144% DLCO uncorrected 79%  IMAGING CXR PA/LAT 08/02/15 (previously reviewed by me): Mild hyperinflation with flattening of the diaphragms and deep sulci bilaterally. Heart normal in size. It is normal in contour. No parenchymal opacity or mass appreciated. No pleural effusion or thickening appreciated.  LABS 09/03/15 ALPHA-1 ANTIRYPSIN: MM (139)    Assessment & Plan:  45 year old male with ongoing tobacco use. the patient's spirometry today shows no evidence for airway obstruction and resolution of prior bronchodilator response. I conveyed this result to the patient and encouraged him to continue to use his Spiriva which I feel is significantly improving his lung function. It's likely that his continued intermittent coughing and wheezing are secondary to ongoing tobacco use and secondhand exposure through his wife as well as possible inhaled chemical irritants from his vapor cigarette use. We did spend a significant amount of time today discussing the need for infection prevention with vaccination, but the patient is very hesitant to undergo repeat vaccination due to a systemic inflammatory response/illness from prior influenza vaccination. I instructed the patient to contact my office for any new breathing problems before his next appointment as I would be happy to see him sooner.   1. Mild COPD: Continuing Spiriva. No changes.  2. Ongoing tobacco use: Patient counseled for over 3 minutes on the need for complete tobacco cessation and avoidance of inhaled fumes and irritants. 3. Health maintenance: I did discuss influenza as well as pneumonia vaccination with the patient today. He is going to continue to consider this but  declines at this time.  4. Follow-up: Patient to return to clinic in 6 months or sooner if needed.  Donna Christen Jamison Neighbor, M.D. [facility] Pulmonary & Critical Care Pager:  [phone] After 3pm or if no response, call [phone] 4:24 PM 02/24/2016

## 2016-02-24 NOTE — Patient Instructions (Signed)
   Continue using your Spiriva as prescribed.  Speak with your wife and try to avoid all tobacco exposure you can.  Seriously consider getting the pneumonia vaccine and/or influenza vaccine to keep you from getting sick & losing lung function.  I will see you back in 6 months or sooner if needed.

## 2016-02-24 NOTE — Progress Notes (Signed)
Spirometry pre and post done today. 

## 2017-06-13 ENCOUNTER — Other Ambulatory Visit: Payer: Self-pay | Admitting: Pulmonary Disease

## 2017-09-14 ENCOUNTER — Ambulatory Visit (INDEPENDENT_AMBULATORY_CARE_PROVIDER_SITE_OTHER)
Admission: RE | Admit: 2017-09-14 | Discharge: 2017-09-14 | Disposition: A | Discharge status: Home / Self Care | Payer: BLUE CROSS/BLUE SHIELD | Source: Ambulatory Visit | Attending: Adult Health | Admitting: Adult Health

## 2017-09-14 ENCOUNTER — Ambulatory Visit (INDEPENDENT_AMBULATORY_CARE_PROVIDER_SITE_OTHER): Payer: BLUE CROSS/BLUE SHIELD | Admitting: Adult Health

## 2017-09-14 ENCOUNTER — Ambulatory Visit: Payer: BLUE CROSS/BLUE SHIELD | Admitting: Pulmonary Disease

## 2017-09-14 ENCOUNTER — Encounter: Payer: Self-pay | Admitting: Adult Health

## 2017-09-14 VITALS — BP 116/66 | HR 73 | Ht 69.0 in | Wt 163.6 lb

## 2017-09-14 DIAGNOSIS — J449 Chronic obstructive pulmonary disease, unspecified: Secondary | ICD-10-CM

## 2017-09-14 LAB — NITRIC OXIDE: NITRIC OXIDE: 224

## 2017-09-14 MED ORDER — BUDESONIDE-FORMOTEROL FUMARATE 160-4.5 MCG/ACT IN AERO: 2.0000 | INHALATION_SPRAY | Freq: Two times a day (BID) | RESPIRATORY_TRACT | 0 refills | Status: DC

## 2017-09-14 MED ORDER — BUDESONIDE-FORMOTEROL FUMARATE 160-4.5 MCG/ACT IN AERO: 2.0000 | INHALATION_SPRAY | Freq: Two times a day (BID) | RESPIRATORY_TRACT | 3 refills | Status: DC

## 2017-09-14 NOTE — Assessment & Plan Note (Signed)
Moderate COPD with asthma component  Worsening obstruction with high FENO  Will change to ICS/LABA combo  Check cxr  Smoking cessation  Consider repeat spirometry and feno on return    Plan  Patient Instructions  Begin Symbicort 160 2 puffs twice daily, rinse after use. This is your controller inhaler .  Using Ventolin 2 puffs every 4 hours as needed for cough and wheezing. This is your rescue inhaler. Work on not smoking or using vapes.  Follow up Dr. Jamison Neighbor in 4-6 weeks and As needed

## 2017-09-14 NOTE — Progress Notes (Signed)
Patient seen in the office today and instructed on use of Symbicort 160.  Patient expressed understanding and demonstrated technique. Boone Master, CMA 09/14/17

## 2017-09-14 NOTE — Addendum Note (Signed)
Addended by: Boone Master E on: 09/14/2017 05:07 PM   Modules accepted: Orders

## 2017-09-14 NOTE — Progress Notes (Signed)
Note reviewed.  Donna Christen Jamison Neighbor, M.D. [facility] Pulmonary & Critical Care Pager:  [phone] After 3pm or if no response, call [phone] 4:18 PM 09/14/17

## 2017-09-14 NOTE — Patient Instructions (Addendum)
Begin Symbicort 160 2 puffs twice daily, rinse after use. This is your controller inhaler .  Using Ventolin 2 puffs every 4 hours as needed for cough and wheezing. This is your rescue inhaler. Work on not smoking or using vapes.  Follow up Dr. Jamison Neighbor in 4-6 weeks and As needed

## 2017-09-14 NOTE — Addendum Note (Signed)
Addended by: Boone Master E on: 09/14/2017 04:10 PM   Modules accepted: Orders

## 2017-09-14 NOTE — Progress Notes (Signed)
  ID: Glenn Dean, male    DOB: February 25, 1971, 46 y.o.   MRN: [id number]  Chief Complaint  Patient presents with  . Follow-up    COPD    Referring provider: Wilburn Mylar, MD  HPI: 46 yo smoker followed for mild COPD   TEST  PFT 02/24/16: FVC 4.57 L (107%) FEV1 3.40 L (99%) FEV1/FVC 0.74 FEF 25-35 2.56 L (73%) negative bronchodilator response 09/03/15: FVC 4.23 L (99%) FEV1 2.60 L (75%) FEV1/FVC 0.61 FEF 25-75 1.34 L (38%) positive bronchodilator response TLC 6.31 L (90%) RV 92% ERV 144% DLCO uncorrected 79%  IMAGING CXR PA/LAT 08/02/15 (previously reviewed by me): Mild hyperinflation with flattening of the diaphragms and deep sulci bilaterally. Heart normal in size. It is normal in contour. No parenchymal opacity or mass appreciated. No pleural effusion or thickening appreciated.  LABS 09/03/15 ALPHA-1 ANTIRYPSIN: MM (139)  09/14/2017 Follow up : COPD  Pt presents for follow up for COPD . Patient says overall he is doing okay. He does get short of breath if he tries to walk a long distance or  carry something heavy. Patient says he does not exercise on a regular basis. Feels like a history shortness of breath . Limits his activity. Patient was pretty on Spiriva but says he has not taken it in greater than a year. Patient continues to smoke. And use a vape. Discussed cessation. Previous PFT and March 2017 showed no airflow obstruction or restriction.Marland Kitchen Spirometry today shows an FEV1 72% ratio 59, FVC.99% FENO today 224 Declines flu shot .   No wheezing . Does cough is starts to laugh.  No allergy symptoms     Allergies  Allergen Reactions  . Norco [Hydrocodone-Acetaminophen] Itching    Nightmares.      There is no immunization history on file for this patient.  Past Medical History:  Diagnosis Date  . Arthritis   . Depression   . H/O spinal cord injury   . PTSD (post-traumatic stress disorder)     Tobacco History: History  Smoking Status  . Current  Every Day Smoker  . Packs/day: 0.25  . Years: 25.00  . Types: Cigarettes  . Start date: 12/25/1988  Smokeless Tobacco  . Never Used    Comment: uses vape & peak rate of 1/2ppd   Ready to quit: No Counseling given: Yes   Outpatient Encounter Prescriptions as of 09/14/2017  Medication Sig  . albuterol (ACCUNEB) 1.25 MG/3ML nebulizer solution INHALE 1 VIAL VIA NEBULIZER EVERY 6 (SIX) HOURS AS NEEDED FOR WHEEZING.  Marland Kitchen albuterol (VENTOLIN HFA) 108 (90 BASE) MCG/ACT inhaler Inhale 2 puffs into the lungs every 6 (six) hours as needed for wheezing or shortness of breath.  . meloxicam (MOBIC) 15 MG tablet Take 15 mg by mouth as needed for pain.  Marland Kitchen sertraline (ZOLOFT) 50 MG tablet Take 50 mg by mouth daily.  . Tiotropium Bromide Monohydrate (SPIRIVA RESPIMAT) 2.5 MCG/ACT AERS Inhale 2 puffs into the lungs daily.   No facility-administered encounter medications on file as of 09/14/2017.      Review of Systems  Constitutional:   No  weight loss, night sweats,  Fevers, chills, fatigue, or  lassitude.  HEENT:   No headaches,  Difficulty swallowing,  Tooth/dental problems, or  Sore throat,                No sneezing, itching, ear ache, nasal congestion, post nasal drip,   CV:  No chest pain,  Orthopnea, PND, swelling in lower  extremities, anasarca, dizziness, palpitations, syncope.   GI  No heartburn, indigestion, abdominal pain, nausea, vomiting, diarrhea, change in bowel habits, loss of appetite, bloody stools.   Resp: no  non-productive cough,  No coughing up of blood.  No change in color of mucus.  No wheezing.  No chest wall deformity  Skin: no rash or lesions.  GU: no dysuria, change in color of urine, no urgency or frequency.  No flank pain, no hematuria   MS:  No joint pain or swelling.  No decreased range of motion.  No back pain.    Physical Exam  BP 116/66 (BP Location: Left Arm, Cuff Size: Normal)   Pulse 73   Ht  (1.753 m)   Wt 163 lb 9.6 oz (74.2 kg)   SpO2 96%    BMI 24.16 kg/m   GEN: A/Ox3; pleasant , NAD, well nourished    HEENT:  [place]/AT,  EACs-clear, TMs-wnl, NOSE-clear, THROAT-clear, no lesions, no postnasal drip or exudate noted.   NECK:  Supple w/ fair ROM; no JVD; normal carotid impulses w/o bruits; no thyromegaly or nodules palpated; no lymphadenopathy.    RESP  Clear  P & A; w/o, wheezes/ rales/ or rhonchi. no accessory muscle use, no dullness to percussion  CARD:  RRR, no m/r/g, no peripheral edema, pulses intact, no cyanosis or clubbing.  GI:   Soft & nt; nml bowel sounds; no organomegaly or masses detected.   Musco: Warm bil, no deformities or joint swelling noted.   Neuro: alert, no focal deficits noted.    Skin: Warm, no lesions or rashes    Lab Results:   BNP No results found for: BNP  ProBNP No results found for: PROBNP  Imaging: No results found.   Assessment & Plan:   COPD, mild Moderate COPD with asthma component  Worsening obstruction with high FENO  Will change to ICS/LABA combo  Check cxr  Smoking cessation  Consider repeat spirometry and feno on return    Plan  Patient Instructions  Begin Symbicort 160 2 puffs twice daily, rinse after use. This is your controller inhaler .  Using Ventolin 2 puffs every 4 hours as needed for cough and wheezing. This is your rescue inhaler. Work on not smoking or using vapes.  Follow up Dr. Jamison Neighbor in 4-6 weeks and As needed          Rubye Oaks, NP 09/14/2017

## 2017-10-19 ENCOUNTER — Ambulatory Visit: Payer: BLUE CROSS/BLUE SHIELD | Admitting: Pulmonary Disease

## 2017-10-26 ENCOUNTER — Emergency Department (HOSPITAL_BASED_OUTPATIENT_CLINIC_OR_DEPARTMENT_OTHER)
Admission: EM | Admit: 2017-10-26 | Discharge: 2017-10-26 | Disposition: A | Discharge status: Home / Self Care | Payer: BLUE CROSS/BLUE SHIELD | Attending: Emergency Medicine | Admitting: Emergency Medicine

## 2017-10-26 ENCOUNTER — Encounter (HOSPITAL_BASED_OUTPATIENT_CLINIC_OR_DEPARTMENT_OTHER): Payer: Self-pay | Admitting: Adult Health

## 2017-10-26 DIAGNOSIS — Z79899 Other long term (current) drug therapy: Secondary | ICD-10-CM | POA: Insufficient documentation

## 2017-10-26 DIAGNOSIS — M79602 Pain in left arm: Secondary | ICD-10-CM | POA: Diagnosis present

## 2017-10-26 DIAGNOSIS — F17210 Nicotine dependence, cigarettes, uncomplicated: Secondary | ICD-10-CM | POA: Insufficient documentation

## 2017-10-26 DIAGNOSIS — R079 Chest pain, unspecified: Secondary | ICD-10-CM | POA: Diagnosis not present

## 2017-10-26 DIAGNOSIS — M5410 Radiculopathy, site unspecified: Secondary | ICD-10-CM | POA: Insufficient documentation

## 2017-10-26 DIAGNOSIS — Z96642 Presence of left artificial hip joint: Secondary | ICD-10-CM | POA: Diagnosis not present

## 2017-10-26 DIAGNOSIS — J449 Chronic obstructive pulmonary disease, unspecified: Secondary | ICD-10-CM | POA: Diagnosis not present

## 2017-10-26 HISTORY — DX: Chronic obstructive pulmonary disease, unspecified: J44.9

## 2017-10-26 MED ORDER — PREDNISONE 20 MG PO TABS: 40.0000 mg | ORAL_TABLET | Freq: Every day | ORAL | 0 refills | Status: DC

## 2017-10-26 MED ORDER — OXYCODONE-ACETAMINOPHEN 5-325 MG PO TABS: 1.0000 | ORAL_TABLET | Freq: Four times a day (QID) | ORAL | 0 refills | Status: DC | PRN

## 2017-10-26 MED FILL — predniSONE 20 MG TABS: 20 | 4 days supply | Qty: 8 | Fill #0

## 2017-10-26 MED FILL — OXYCODONE-ACETAMINOPHEN 5-3: 5-325 | 2 days supply | Qty: 6 | Fill #0

## 2017-10-26 NOTE — ED Notes (Signed)
ED Provider at bedside. 

## 2017-10-26 NOTE — ED Provider Notes (Signed)
[facility] EMERGENCY DEPARTMENT Provider Note   CSN: [id number] Arrival date & time: 10/26/17  0730     History   Chief Complaint Chief Complaint  Patient presents with  . Numbness    HPI Glenn Dean is a 46 y.o. male.  HPI Patient presents with pain in his left upper extremity.  States there is some numbness in the middle 3 fingers of his left hand.  Pain goes up to his shoulder and his neck.  States he cannot hold a phone between her shoulder and neck because of the pain.  Has had it for the last few days.  No trauma.  Has not had pains like this before.  Had previous lumbar spinal injury but never had a cervical spine injury.  No fall.  No fevers or chills.  Occasionally the pain will seem to radiate into the chest also.  He has previously had rotator cuff surgery on the side.  He has had some mild relief with Excedrin. Past Medical History:  Diagnosis Date  . Arthritis   . COPD (chronic obstructive pulmonary disease) (HCC)   . Depression   . H/O spinal cord injury   . PTSD (post-traumatic stress disorder)     Patient Active Problem List   Diagnosis Date Noted  . COPD, mild (HCC) 09/03/2015  . Wheezing 08/02/2015  . Cough 08/02/2015  . Insomnia 08/02/2015  . PTSD (post-traumatic stress disorder) 08/02/2015  . Primary osteoarthritis of left hip 11/04/2014    Past Surgical History:  Procedure Laterality Date  . TOTAL HIP ARTHROPLASTY Left 11/04/2014   Procedure: LEFT TOTAL HIP ARTHROPLASTY ANTERIOR APPROACH;  Surgeon: Loanne Drilling, MD;  Location: [facility];  Service: Orthopedics;  Laterality: Left;       Home Medications    Prior to Admission medications   Medication Sig Start Date End Date Taking? Authorizing Provider  albuterol (ACCUNEB) 1.25 MG/3ML nebulizer solution INHALE 1 VIAL VIA NEBULIZER EVERY 6 (SIX) HOURS AS NEEDED FOR WHEEZING. 06/13/17   Roslynn Amble, MD  albuterol (VENTOLIN HFA) 108 (90 BASE) MCG/ACT inhaler Inhale 2 puffs  into the lungs every 6 (six) hours as needed for wheezing or shortness of breath.    [provider]  budesonide-formoterol (SYMBICORT) 160-4.5 MCG/ACT inhaler Inhale 2 puffs into the lungs 2 (two) times daily. 09/14/17   Parrett, Virgel Bouquet, NP  budesonide-formoterol (SYMBICORT) 160-4.5 MCG/ACT inhaler Inhale 2 puffs into the lungs 2 (two) times daily. 09/14/17   Parrett, Virgel Bouquet, NP  meloxicam (MOBIC) 15 MG tablet Take 15 mg by mouth as needed for pain.    [provider]  oxyCODONE-acetaminophen (PERCOCET/ROXICET) 5-325 MG tablet Take 1-2 tablets by mouth every 6 (six) hours as needed for severe pain. 10/26/17   Benjiman Core, MD  predniSONE (DELTASONE) 20 MG tablet Take 2 tablets (40 mg total) by mouth daily. 10/26/17   Benjiman Core, MD  sertraline (ZOLOFT) 50 MG tablet Take 50 mg by mouth daily.    [provider]    Family History Family History  Problem Relation Age of Onset  . Diabetes Mother   . Hypertension Mother   . Diabetes Father   . Hypertension Father   . Congestive Heart Failure Maternal Grandmother   . Asthma Son   . Allergies Son   . Allergies Daughter   . Rheumatologic disease Neg Hx     Social History Social History  Substance Use Topics  . Smoking status: Current Every Day Smoker  Packs/day: 0.25    Years: 25.00    Types: Cigarettes    Start date: 12/25/1988  . Smokeless tobacco: Never Used     Comment: uses vape & peak rate of 1/2ppd  . Alcohol use 4.8 oz/week    8 Cans of beer per week     Comment: per week     Allergies   Norco [hydrocodone-acetaminophen]   Review of Systems Review of Systems  Constitutional: Negative for appetite change.  HENT: Negative for congestion.   Respiratory: Negative for shortness of breath.   Cardiovascular: Negative for chest pain.  Gastrointestinal: Negative for abdominal pain.  Musculoskeletal: Negative for back pain and neck pain.       Shoulder pain  Skin: Negative for rash.    Neurological: Positive for numbness. Negative for weakness.     Physical Exam Updated Vital Signs BP 138/88 (BP Location: Right Arm)   Pulse 82   Temp 97.7 F (36.5 C) (Oral)   Resp 16   Ht 5\' 9"  (1.753 m)   Wt 72.6 kg (160 lb)   SpO2 100%   BMI 23.63 kg/m   Physical Exam  Constitutional: He appears well-developed.  HENT:  Head: Atraumatic.  Eyes: EOM are normal.  Neck: Neck supple.  Cardiovascular: Normal rate.   Abdominal: Soft.  Musculoskeletal: He exhibits tenderness.  Some tenderness over left shoulder anteriorly.  Mildly decreased range of motion.  No midline cervical tenderness.  No real trapezius tenderness.  No increased pain with axial loading on his head.  Some paresthesias over fifth fourth and third finger of the palm of the left hand.  Good radial median ulnar strength otherwise.  Does have some pain with making a fist.  Good range of motion in wrist and elbow.  Good capillary refill and strong radial pulse.  Skin: Skin is warm. Capillary refill takes less than 2 seconds.  Psychiatric: He has a normal mood and affect.     ED Treatments / Results  Labs (all labs ordered are listed, but only abnormal results are displayed) Labs Reviewed - No data to display  EKG  EKG Interpretation None       Radiology No results found.  Procedures Procedures (including critical care time)  Medications Ordered in ED Medications - No data to display   Initial Impression / Assessment and Plan / ED Course  I have reviewed the triage vital signs and the nursing notes.  Pertinent labs & imaging results that were available during my care of the patient were reviewed by me and considered in my medical decision making (see chart for details).     Patient with hand and upper extremity pain.  Likely radiculopathy versus neuropathy.  I do not think x-rays would really add much at this time.  Will give short course of steroids and some pain medicine.  Will have follow-up  with primary care doctor or sports medicine.  Will discharge home.  Final Clinical Impressions(s) / ED Diagnoses   Final diagnoses:  Radiculopathy, unspecified spinal region    New Prescriptions New Prescriptions   OXYCODONE-ACETAMINOPHEN (PERCOCET/ROXICET) 5-325 MG TABLET    Take 1-2 tablets by mouth every 6 (six) hours as needed for severe pain.   PREDNISONE (DELTASONE) 20 MG TABLET    Take 2 tablets (40 mg total) by mouth daily.     Benjiman CorePickering, Samarra Ridgely, MD 10/26/17 [phone]

## 2017-10-26 NOTE — ED Triage Notes (Signed)
PResents with left arm pain that began one week ago and has progressed to tingling in the left arm that is constant. Pain is worse with abduction of left arm tingling is in last three fingers and all the way up arm into shoulder and back of neck. He tried excedrin with mild relief. HE also endorses slight chest pain with this pain but states that went away.

## 2017-11-05 ENCOUNTER — Ambulatory Visit (INDEPENDENT_AMBULATORY_CARE_PROVIDER_SITE_OTHER): Payer: BLUE CROSS/BLUE SHIELD | Admitting: Pulmonary Disease

## 2017-11-05 ENCOUNTER — Encounter: Payer: Self-pay | Admitting: Pulmonary Disease

## 2017-11-05 VITALS — BP 130/70 | HR 63 | Wt 173.1 lb

## 2017-11-05 DIAGNOSIS — F17200 Nicotine dependence, unspecified, uncomplicated: Secondary | ICD-10-CM | POA: Diagnosis not present

## 2017-11-05 DIAGNOSIS — J449 Chronic obstructive pulmonary disease, unspecified: Secondary | ICD-10-CM

## 2017-11-05 DIAGNOSIS — B370 Candidal stomatitis: Secondary | ICD-10-CM

## 2017-11-05 MED ORDER — ALBUTEROL SULFATE HFA 108 (90 BASE) MCG/ACT IN AERS: 2.0000 | INHALATION_SPRAY | RESPIRATORY_TRACT | 3 refills | Status: DC | PRN

## 2017-11-05 MED ORDER — SPACER/AERO CHAMBER MOUTHPIECE MISC: 1.0000 | 0 refills | Status: AC

## 2017-11-05 MED ORDER — NYSTATIN 100000 UNIT/ML MT SUSP: 5.0000 mL | Freq: Four times a day (QID) | OROMUCOSAL | 0 refills | Status: DC

## 2017-11-05 MED ORDER — BUDESONIDE-FORMOTEROL FUMARATE 160-4.5 MCG/ACT IN AERO: 2.0000 | INHALATION_SPRAY | Freq: Two times a day (BID) | RESPIRATORY_TRACT | 6 refills | Status: DC

## 2017-11-05 NOTE — Progress Notes (Signed)
Subjective:    Patient ID: Glenn Dean, male    DOB: 1971/11/23, 46 y.o.   MRN: [id number]  C.C.:  Follow-up for Mild COPD & Tobacco Use Disorder.  HPI Mild COPD: May have some asthma overlap. Previously on Spiriva. Started on Symbicort 160/4.5 during office visit in September with nurse practitioner. He reports his coughing & dyspnea have improved on the Symbicort. He denies any wheezing.   Tobacco use disorder: Previously using vapor cigarette and smoking 3 cigarettes a week at last appointment with me. He reports he is not using his Vapor Cigarette. He is smoking 1-2 cigarettes per week.   Review of Systems No chest pain or pressure. No fever or chills. No abdominal pain or nausea.   Allergies  Allergen Reactions  . Norco [Hydrocodone-Acetaminophen] Itching    Nightmares.    Current Outpatient Medications on File Prior to Visit  Medication Sig Dispense Refill  . albuterol (ACCUNEB) 1.25 MG/3ML nebulizer solution INHALE 1 VIAL VIA NEBULIZER EVERY 6 (SIX) HOURS AS NEEDED FOR WHEEZING. 150 mL 0  . albuterol (VENTOLIN HFA) 108 (90 BASE) MCG/ACT inhaler Inhale 2 puffs into the lungs every 6 (six) hours as needed for wheezing or shortness of breath.    . gabapentin (NEURONTIN) 600 MG tablet Take 600 mg as needed by mouth.    . sertraline (ZOLOFT) 50 MG tablet Take 50 mg by mouth daily.    . budesonide-formoterol (SYMBICORT) 160-4.5 MCG/ACT inhaler Inhale 2 puffs into the lungs 2 (two) times daily. (Patient not taking: Reported on 11/05/2017) 2 Inhaler 0   No current facility-administered medications on file prior to visit.    Past Medical History:  Diagnosis Date  . Arthritis   . COPD (chronic obstructive pulmonary disease) (HCC)   . Depression   . H/O spinal cord injury   . PTSD (post-traumatic stress disorder)    No past surgical history on file. Family History  Problem Relation Age of Onset  . Diabetes Mother   . Hypertension Mother   . Diabetes Father   .  Hypertension Father   . Congestive Heart Failure Maternal Grandmother   . Asthma Son   . Allergies Son   . Allergies Daughter   . Rheumatologic disease Neg Hx     Social History   Socioeconomic History  . Marital status: Married    Spouse name: None  . Number of children: None  . Years of education: None  . Highest education level: None  Social Needs  . Financial resource strain: None  . Food insecurity - worry: None  . Food insecurity - inability: None  . Transportation needs - medical: None  . Transportation needs - non-medical: None  Occupational History  . Occupation: bus Designer, television/film setoperator  Tobacco Use  . Smoking status: Current Every Day Smoker    Packs/day: 0.25    Years: 25.00    Pack years: 6.25    Types: Cigarettes    Start date: 12/25/1988  . Smokeless tobacco: Never Used  . Tobacco comment: uses vape & peak rate of 1/2ppd  Substance and Sexual Activity  . Alcohol use: Yes    Alcohol/week: 4.8 oz    Types: 8 Cans of beer per week    Comment: per week  . Drug use: No  . Sexual activity: None  Other Topics Concern  . None  Social History Narrative   [place] to [place], [place], [place], [place], [place], [place], [place], [place], [place], [place], [place], & [place].  He served in [employer] and has been to [place], [place], [place] & [place] 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 He has a dog at home. No bird, mold, or hot tub exposure. Enjoys fishing and shoot firearms.       Objective:    BP 130/70 (BP Location: Left Arm, Cuff Size: Normal)   Pulse 63   Wt 173 lb 2 oz (78.5 kg)   SpO2 98%   BMI 25.57 kg/m   General:  Awake. Alert. African-American male.  Integument:  Warm. Dry. No rash. Extremities:  No cyanosis or clubbing.  HEENT:  Thrush present on tongue. Moist mucus membranes. No oral ulcers. No  scleral injection or icterus. Cardiovascular:  Regular rate. No edema. Regular rhythm.  Pulmonary:  Clear bilaterally to auscultation. Normal work of breathing on room air. Abdomen: Soft. Normal bowel sounds. Nondistended. Musculoskeletal:  Normal bulk and tone. No joint deformity or effusion appreciated. Brace present on left wrist. Neurological:  Cranial nerves 2-12 grossly in tact. No meningismus. Moving all 4 extremities equally.   PFT 02/24/16: FVC 4.57 L (107%) FEV1 3.40 L (99%) FEV1/FVC 0.74 FEF 25-35 2.56 L (73%) negative bronchodilator response 09/03/15: FVC 4.23 L (99%) FEV1 2.60 L (75%) FEV1/FVC 0.61 FEF 25-75 1.34 L (38%) positive bronchodilator response TLC 6.31 L (90%) RV 92% ERV 144% DLCO uncorrected 79%  IMAGING CXR PA/LAT 09/14/17 (personally reviewed by me):  Hyperinflation suggested by flattening of the diaphragms. No parenchymal mass or opacity appreciated. No pleural effusion. Heart normal in size & mediastinum normal in contour.  CXR PA/LAT 08/02/15 (previously reviewed by me): Mild hyperinflation with flattening of the diaphragms and deep sulci bilaterally. Heart normal in size. It is normal in contour. No parenchymal opacity or mass appreciated. No pleural effusion or thickening appreciated.  LABS 09/03/15 ALPHA-1 ANTIRYPSIN: MM (139)    Assessment & Plan:  46 y.o. male with tobacco use disorder and mild COPD based on previous spirometry. It is quite possible that he has an overlap syndrome given his symptomatic response to Symbicort and inhaled corticosteroid therapy. His previous chest x-ray did not show any evidence of a parenchymal cause for his previous cough. On physical exam today he does have oral thrush. We discussed proper oral hygiene and prevention techniques at length. We also discussed his ongoing tobacco use. I believe the cessation of his vapor cigarettes may help some of his symptoms. However, we did review the fact that until he completely ceases tobacco use  his pulmonary function will likely continue to decline. I instructed him to contact our office if he had any new breathing problems or questions before his next appointment.  1. Mild COPD: Continuing patient on Symbicort 160/4.5. Prescribed a Provera rescue inhaler as well as prescription for Symbicort today. Provided spacer to use with Symbicort to help prevent thrush. 2. Oral Thrush: Patient instructed on proper oral hygiene with Symbicort. Trying spacer. Prescribing nystatin swish and swallow. 3. Tobacco use disorder: Counseled for over 3 minutes and need for complete tobacco cessation. 4. Health maintenance: Patient declined immunization at last appointment. 5. Follow-up: Return to clinic in 6 months or sooner if needed.  Donna ChristenJennings E. Jamison NeighborNestor, M.D. [facility] Pager:  [phone] After 3pm or if no response, call [phone] 9:49 AM 11/05/17

## 2017-11-05 NOTE — Patient Instructions (Addendum)
   Try to completely quit smoking.  We are keeping you on your Symbicort inhaler.   I'm sending in a prescription for your ProAir & Symbicort inhalers today.  Remember to remove any dentures or partials you have before you use your Symbicort inhaler. Remember to brush your teeth & tongue after you use your inhaler as well as rinse, gargle & spit to keep from getting thrush in your mouth or on your tongue (a white film).   I'm sending in a prescription for Nystatin a liquid to clear up the white film/thrush on your tongue.  Use the spacer we are giving you today with your Symbicort inhaler.  We will see you back in 6 months or sooner if needed. Call if you have any questions or concerns.

## 2018-03-20 ENCOUNTER — Telehealth: Payer: Self-pay | Admitting: Adult Health

## 2018-03-20 NOTE — Telephone Encounter (Signed)
Spoke with patient. He stated that he switched insurance companies at the beginning of the year and his copay for Symbicort increased from $60 monthly to $300 monthly.   He wants to know if there any other medications that similar to Symbicort but would be cheaper. Advised patient to contact his insurance company and get a copy of his formulary.   TP, please advise. Thanks!

## 2018-03-21 NOTE — Telephone Encounter (Signed)
Called patient unable to reach left message to give us a call back.

## 2018-03-21 NOTE — Telephone Encounter (Signed)
Per TP: it would be best to wait for the formulary to ensure insurance coverage.  We can provide a sample while he awaits this.  Thanks.

## 2018-03-22 NOTE — Telephone Encounter (Signed)
LMTCB x2  

## 2018-03-25 MED ORDER — BUDESONIDE-FORMOTEROL FUMARATE 160-4.5 MCG/ACT IN AERO: 2.0000 | INHALATION_SPRAY | Freq: Two times a day (BID) | RESPIRATORY_TRACT | 6 refills | Status: DC

## 2018-03-25 NOTE — Telephone Encounter (Signed)
Called and spoke with patient about what medications are on his formulary. Patient stated that Symbicort was working well for him and that he would [facility] so that he will be able to get it there and it be covered.   [facility], notified patient of fax. Patient thanked staff for sending it.  Nothing further is needed at this time.

## 2018-05-06 ENCOUNTER — Encounter: Payer: Self-pay | Admitting: Adult Health

## 2018-05-06 ENCOUNTER — Ambulatory Visit (INDEPENDENT_AMBULATORY_CARE_PROVIDER_SITE_OTHER): Payer: Commercial Managed Care - PPO | Admitting: Adult Health

## 2018-05-06 DIAGNOSIS — J449 Chronic obstructive pulmonary disease, unspecified: Secondary | ICD-10-CM

## 2018-05-06 DIAGNOSIS — Z716 Tobacco abuse counseling: Secondary | ICD-10-CM | POA: Diagnosis not present

## 2018-05-06 MED ORDER — BUDESONIDE-FORMOTEROL FUMARATE 160-4.5 MCG/ACT IN AERO: 2.0000 | INHALATION_SPRAY | Freq: Two times a day (BID) | RESPIRATORY_TRACT | 0 refills | Status: DC

## 2018-05-06 NOTE — Assessment & Plan Note (Signed)
Smoking cessation  

## 2018-05-06 NOTE — Progress Notes (Signed)
  ID: Glenn Dean, male    DOB: [birth year], 47 y.o.   MRN: [id number]  Chief Complaint  Patient presents with  . Follow-up    COPD     Referring provider: Wilburn Mylar, MD  HPI: 47 year old male active smoker followed for mild COPD with asthma  TEST  PFT 02/24/16: FVC 4.57 L (107%) FEV1 3.40 L (99%) FEV1/FVC 0.74 FEF 25-35 2.56 L (73%) negative bronchodilator response 09/03/15: FVC 4.23 L (99%) FEV1 2.60 L (75%) FEV1/FVC 0.61 FEF 25-75 1.34 L (38%) positive bronchodilator response TLC 6.31 L (90%) RV 92% ERV 144% DLCO uncorrected 79%  09/03/15 ALPHA-1 ANTIRYPSIN: MM (139)   05/06/2018 Follow up ; COPD /Asthma, Smoker  Patient returns for a six-month follow-up.  She has underlying my COPD with an asthma component.  He is on  Symbicort but tells me that his insurance is has a deductibele now and has been out of for few weeks.  Patient has cut back on smoking.  We discussed smoking cessation. Pt is active, works fulltime and goes to gym.  Chest x-ray September 2018 showed clear lungs.  Allergies  Allergen Reactions  . Norco [Hydrocodone-Acetaminophen] Itching    Nightmares.      There is no immunization history on file for this patient.  Past Medical History:  Diagnosis Date  . Arthritis   . COPD (chronic obstructive pulmonary disease) (HCC)   . Depression   . H/O spinal cord injury   . PTSD (post-traumatic stress disorder)     Tobacco History: Social History   Tobacco Use  Smoking Status Current Every Day Smoker  . Packs/day: 0.25  . Years: 25.00  . Pack years: 6.25  . Types: Cigarettes  . Start date: 12/25/1988  Smokeless Tobacco Never Used  Tobacco Comment   uses vape & peak rate of 1/2ppd   Ready to quit: No Counseling given: Yes Comment: uses vape & peak rate of 1/2ppd   Outpatient Encounter Medications as of 05/06/2018  Medication Sig  . albuterol (ACCUNEB) 1.25 MG/3ML nebulizer solution INHALE 1 VIAL VIA NEBULIZER EVERY 6 (SIX)  HOURS AS NEEDED FOR WHEEZING.  Marland Kitchen albuterol (VENTOLIN HFA) 108 (90 Base) MCG/ACT inhaler Inhale 2 puffs every 4 (four) hours as needed into the lungs for wheezing or shortness of breath.  . sertraline (ZOLOFT) 50 MG tablet Take 50 mg by mouth daily.  Marland Kitchen Spacer/Aero Chamber Mouthpiece MISC 1 Device as directed by Does not apply route.  . [DISCONTINUED] budesonide-formoterol (SYMBICORT) 160-4.5 MCG/ACT inhaler Inhale 2 puffs into the lungs 2 (two) times daily.  . budesonide-formoterol (SYMBICORT) 160-4.5 MCG/ACT inhaler Inhale 2 puffs into the lungs 2 (two) times daily.  . [DISCONTINUED] budesonide-formoterol (SYMBICORT) 160-4.5 MCG/ACT inhaler Inhale 2 puffs into the lungs every 12 (twelve) hours. (Patient not taking: Reported on 05/06/2018)  . [DISCONTINUED] gabapentin (NEURONTIN) 600 MG tablet Take 600 mg as needed by mouth.  . [DISCONTINUED] nystatin (MYCOSTATIN) 100000 UNIT/ML suspension Take 5 mLs (500,000 Units total) 4 (four) times daily by mouth.   No facility-administered encounter medications on file as of 05/06/2018.      Review of Systems  Constitutional:   No  weight loss, night sweats,  Fevers, chills, fatigue, or  lassitude.  HEENT:   No headaches,  Difficulty swallowing,  Tooth/dental problems, or  Sore throat,                No sneezing, itching, ear ache, nasal congestion, post nasal drip,   CV:  No chest  pain,  Orthopnea, PND, swelling in lower extremities, anasarca, dizziness, palpitations, syncope.   GI  No heartburn, indigestion, abdominal pain, nausea, vomiting, diarrhea, change in bowel habits, loss of appetite, bloody stools.   Resp:   No chest wall deformity  Skin: no rash or lesions.  GU: no dysuria, change in color of urine, no urgency or frequency.  No flank pain, no hematuria   MS:  No joint pain or swelling.  No decreased range of motion.  No back pain.    Physical Exam  BP 110/72 (BP Location: Left Arm, Cuff Size: Normal)   Pulse 82   Ht  (1.753  m)   Wt 173 lb (78.5 kg)   SpO2 97%   BMI 25.55 kg/m   GEN: A/Ox3; pleasant , NAD, well nourished    HEENT:  [place]/AT,  EACs-clear, TMs-wnl, NOSE-clear, THROAT-clear, no lesions, no postnasal drip or exudate noted.   NECK:  Supple w/ fair ROM; no JVD; normal carotid impulses w/o bruits; no thyromegaly or nodules palpated; no lymphadenopathy.    RESP  Clear  P & A; w/o, wheezes/ rales/ or rhonchi. no accessory muscle use, no dullness to percussion  CARD:  RRR, no m/r/g, no peripheral edema, pulses intact, no cyanosis or clubbing.  GI:   Soft & nt; nml bowel sounds; no organomegaly or masses detected.   Musco: Warm bil, no deformities or joint swelling noted.   Neuro: alert, no focal deficits noted.    Skin: Warm, no lesions or rashes    Lab Results:  CBC  BNP No results found for: BNP  ProBNP No results found for: PROBNP  Imaging: No results found.   Assessment & Plan:   COPD, mild Compensated on present regimen  Will restart Symbicort , Copay card and sample given to help with costs   Plan  Patient Instructions  Continue on Symbicort 160 2 puffs twice daily, rinse after use. This is your controller inhaler .  Using Ventolin 2 puffs every 4 hours as needed for cough and wheezing. This is your rescue inhaler. Work on not smoking or using vapes.  Follow up Dr. Isaiah Serge in 6 months and As needed        Encounter for smoking cessation counseling Smoking cessation      Rubye Oaks, NP 05/06/2018

## 2018-05-06 NOTE — Assessment & Plan Note (Signed)
Compensated on present regimen  Will restart Symbicort , Copay card and sample given to help with costs   Plan  Patient Instructions  Continue on Symbicort 160 2 puffs twice daily, rinse after use. This is your controller inhaler .  Using Ventolin 2 puffs every 4 hours as needed for cough and wheezing. This is your rescue inhaler. Work on not smoking or using vapes.  Follow up Dr. Isaiah Serge in 6 months and As needed

## 2018-05-06 NOTE — Patient Instructions (Signed)
Continue on Symbicort 160 2 puffs twice daily, rinse after use. This is your controller inhaler .  Using Ventolin 2 puffs every 4 hours as needed for cough and wheezing. This is your rescue inhaler. Work on not smoking or using vapes.  Follow up Dr. Isaiah Serge in 6 months and As needed

## 2018-05-15 ENCOUNTER — Telehealth: Payer: Self-pay | Admitting: Adult Health

## 2018-05-15 NOTE — Telephone Encounter (Signed)
Pt last seen by TP on 5.13.19 for COPD follow up  TP had wanted patient to have an aerochamber  Called spoke with patient, discussed using an aerochamber Pt unsure if he would like to proceed d/t cost ($31) Pt stated he may still pick up aerochamber this coming Tuesday but will call the office next week  Aerochamber with [facility] paper placed up front for pt to pick up Routing to myself for follow up

## 2018-05-23 NOTE — Telephone Encounter (Signed)
Aerochamber still up front, pt has not picked this up yet Will sign off

## 2018-06-06 ENCOUNTER — Ambulatory Visit (INDEPENDENT_AMBULATORY_CARE_PROVIDER_SITE_OTHER): Payer: Commercial Managed Care - PPO | Admitting: Adult Health

## 2018-06-06 ENCOUNTER — Encounter: Payer: Self-pay | Admitting: Adult Health

## 2018-06-06 DIAGNOSIS — J449 Chronic obstructive pulmonary disease, unspecified: Secondary | ICD-10-CM | POA: Diagnosis not present

## 2018-06-06 MED ORDER — BUDESONIDE-FORMOTEROL FUMARATE 160-4.5 MCG/ACT IN AERO: 2.0000 | INHALATION_SPRAY | Freq: Two times a day (BID) | RESPIRATORY_TRACT | 0 refills | Status: DC

## 2018-06-06 MED ORDER — PREDNISONE 10 MG PO TABS: ORAL_TABLET | ORAL | 0 refills | Status: DC

## 2018-06-06 MED ORDER — AZITHROMYCIN 250 MG PO TABS: ORAL_TABLET | ORAL | 0 refills | Status: AC

## 2018-06-06 NOTE — Patient Instructions (Signed)
Zpack take as directed .  Mucinex DM Twice daily  As needed  Cough /congestion  Prednisone taper over next week .  Restart Symbicort 160 2 puffs twice daily, rinse after use. This is your controller inhaler .  Using Ventolin 2 puffs every 4 hours as needed for cough and wheezing. This is your rescue inhaler. Work on not smoking or using vapes.  Follow up Dr. Isaiah SergeMannam in 6 months and As needed

## 2018-06-06 NOTE — Assessment & Plan Note (Signed)
Mild flare with URI/early bronchitis Xopenex nebulizer treatment given in the office Use Zyrtec as needed for drainage Smoking cessation discussed  Plan  Patient Instructions  Zpack take as directed .  Mucinex DM Twice daily  As needed  Cough /congestion  Prednisone taper over next week .  Restart Symbicort 160 2 puffs twice daily, rinse after use. This is your controller inhaler .  Using Ventolin 2 puffs every 4 hours as needed for cough and wheezing. This is your rescue inhaler. Work on not smoking or using vapes.  Follow up Dr. Isaiah SergeMannam in 6 months and As needed

## 2018-06-06 NOTE — Progress Notes (Signed)
 @Patient  ID: Glenn Dean, male    DOB: Feb 22, 1971, 47 y.o.   MRN: [id number]  Chief Complaint  Patient presents with  . Acute Visit    Referring provider: Wilburn MylarKelly, Samuel S, MD  HPI: 47 year old male active smoker followed for mild COPD with asthma  Test  TEST  PFT 02/24/16: FVC 4.57 L (107%) FEV1 3.40 L (99%) FEV1/FVC 0.74 FEF 25-35 2.56 L (73%) negative bronchodilator response 09/03/15: FVC 4.23 L (99%) FEV1 2.60 L (75%) FEV1/FVC 0.61 FEF 25-75 1.34 L (38%) positive bronchodilator response TLC 6.31 L (90%) RV 92% ERV 144% DLCO uncorrected 79%  09/03/15 ALPHA-1 ANTIRYPSIN: MM (139)  06/06/2018 Acute OV : COPD Dorian Heckle/Asthma /Smoker  Patient presents for an acute office visit.  He complains of 1 week of nasal congestion drainage watery eyes increased cough wheezing and hoarseness.  He denies any fever, chest pain, orthopnea or edema.  Patient lost his Symbicort inhaler.  Patient does continue to smoke some but has cut back on smoking.  Smoking cessation was discussed.    Allergies  Allergen Reactions  . Norco [Hydrocodone-Acetaminophen] Itching    Nightmares.      There is no immunization history on file for this patient.  Past Medical History:  Diagnosis Date  . Arthritis   . COPD (chronic obstructive pulmonary disease) (HCC)   . Depression   . H/O spinal cord injury   . PTSD (post-traumatic stress disorder)     Tobacco History: Social History   Tobacco Use  Smoking Status Current Every Day Smoker  . Packs/day: 0.25  . Years: 25.00  . Pack years: 6.25  . Types: Cigarettes  . Start date: 12/25/1988  Smokeless Tobacco Never Used  Tobacco Comment   uses vape & peak rate of 1/2ppd   Ready to quit: No Counseling given: Yes Comment: uses vape & peak rate of 1/2ppd   Outpatient Encounter Medications as of 06/06/2018  Medication Sig  . albuterol (ACCUNEB) 1.25 MG/3ML nebulizer solution INHALE 1 VIAL VIA NEBULIZER EVERY 6 (SIX) HOURS AS NEEDED FOR WHEEZING.  Marland Kitchen.  albuterol (VENTOLIN HFA) 108 (90 Base) MCG/ACT inhaler Inhale 2 puffs every 4 (four) hours as needed into the lungs for wheezing or shortness of breath.  . budesonide-formoterol (SYMBICORT) 160-4.5 MCG/ACT inhaler Inhale 2 puffs into the lungs 2 (two) times daily.  . sertraline (ZOLOFT) 50 MG tablet Take 50 mg by mouth daily.  Marland Kitchen. Spacer/Aero Chamber Mouthpiece MISC 1 Device as directed by Does not apply route.  Marland Kitchen. azithromycin (ZITHROMAX Z-PAK) 250 MG tablet Take 2 tablets (500 mg) on  Day 1,  followed by 1 tablet (250 mg) once daily on Days 2 through 5.  . budesonide-formoterol (SYMBICORT) 160-4.5 MCG/ACT inhaler Inhale 2 puffs into the lungs 2 (two) times daily.  . predniSONE (DELTASONE) 10 MG tablet 4 tabs for 2 days, then 3 tabs for 2 days, 2 tabs for 2 days, then 1 tab for 2 days, then stop   No facility-administered encounter medications on file as of 06/06/2018.      Review of Systems  Constitutional:   No  weight loss, night sweats,  Fevers, chills, fatigue, or  lassitude.  HEENT:   No headaches,  Difficulty swallowing,  Tooth/dental problems, or  Sore throat,                No sneezing, itching, ear ache,  +nasal congestion, post nasal drip,   CV:  No chest pain,  Orthopnea, PND, swelling in lower extremities, anasarca, dizziness,  palpitations, syncope.   GI  No heartburn, indigestion, abdominal pain, nausea, vomiting, diarrhea, change in bowel habits, loss of appetite, bloody stools.   Resp: .  No chest wall deformity  Skin: no rash or lesions.  GU: no dysuria, change in color of urine, no urgency or frequency.  No flank pain, no hematuria   MS:  No joint pain or swelling.  No decreased range of motion.  No back pain.    Physical Exam  BP 120/68 (BP Location: Left Arm, Cuff Size: Normal)   Pulse 60   Ht 5\' 9"  (1.753 m)   Wt 169 lb 1.3 oz (76.7 kg)   SpO2 98%   BMI 24.97 kg/m   GEN: A/Ox3; pleasant , NAD, well nourished    HEENT:  [place]/AT,  EACs-clear, TMs-wnl,  NOSE-clear drainage, THROAT-clear, no lesions, no postnasal drip or exudate noted.   NECK:  Supple w/ fair ROM; no JVD; normal carotid impulses w/o bruits; no thyromegaly or nodules palpated; no lymphadenopathy.    RESP  Clear  P & A; w/o, wheezes/ rales/ or rhonchi. no accessory muscle use, no dullness to percussion  CARD:  RRR, no m/r/g, no peripheral edema, pulses intact, no cyanosis or clubbing.  GI:   Soft & nt; nml bowel sounds; no organomegaly or masses detected.   Musco: Warm bil, no deformities or joint swelling noted.   Neuro: alert, no focal deficits noted.    Skin: Warm, no lesions or rashes    Lab Results:     BNP No results found for: BNP  ProBNP No results found for: PROBNP  Imaging: No results found.   Assessment & Plan:   COPD, mild Mild flare with URI/early bronchitis Xopenex nebulizer treatment given in the office Use Zyrtec as needed for drainage Smoking cessation discussed  Plan  Patient Instructions  Zpack take as directed .  Mucinex DM Twice daily  As needed  Cough /congestion  Prednisone taper over next week .  Restart Symbicort 160 2 puffs twice daily, rinse after use. This is your controller inhaler .  Using Ventolin 2 puffs every 4 hours as needed for cough and wheezing. This is your rescue inhaler. Work on not smoking or using vapes.  Follow up Dr. Isaiah Serge in 6 months and As needed           Rubye Oaks, NP 06/06/2018

## 2019-03-13 NOTE — Telephone Encounter (Signed)
I understand his concern. All we can say is that he is at risk due to underlying COPD  Smoking cessation during this time period is imperative Let his work know but that is a decision for him and his employer.   If continues to work, could wear mask and gloves .  Wash hands frequently , use gel.  Social distancing as much as possible   Please contact office for sooner follow up if symptoms do not improve or worsen or seek emergency care

## 2019-03-13 NOTE — Telephone Encounter (Signed)
Please see message. °

## 2019-03-13 NOTE — Telephone Encounter (Signed)
TP please advise on patients e-mail above. Thank you.

## 2019-03-18 ENCOUNTER — Other Ambulatory Visit: Payer: Self-pay

## 2019-03-18 MED ORDER — BUDESONIDE-FORMOTEROL FUMARATE 160-4.5 MCG/ACT IN AERO: 2.0000 | INHALATION_SPRAY | Freq: Two times a day (BID) | RESPIRATORY_TRACT | 3 refills | Status: DC

## 2019-03-18 MED ORDER — ALBUTEROL SULFATE HFA 108 (90 BASE) MCG/ACT IN AERS: 2.0000 | INHALATION_SPRAY | RESPIRATORY_TRACT | 3 refills | Status: DC | PRN

## 2019-03-24 ENCOUNTER — Encounter: Payer: Self-pay | Admitting: *Deleted

## 2019-03-24 NOTE — Telephone Encounter (Signed)
Glenn Dean, this message was received this morning.  [facility].I pray this email finds all in good health and spirits.My reason for my request is to ask for a note for work.Detailing my need to stay out of work due to my underlying health conditions.Not everyone is being understanding during this pandemic .I get it the fact that a lot of people are using this as an excuse but those of [place] who are seriously @ risk are suffering for it.So if you can just email a letter stating my need to be off from work until this situation is stabilized.Thank you so much for your cooperation.  Message routed to Dayton Bailiff, NP

## 2019-03-24 NOTE — Telephone Encounter (Signed)
That is fine, can send letter outling that pt has underlying lung disorder that places him at higher risk for complications .   We can not write him out of work but can write note that recommends him to work from home or that he is higher risk group/population   I am glad to sign . He is my chart active so could write and send through my chart.

## 2019-04-09 NOTE — Telephone Encounter (Signed)
Same advise as before:  I understand his concern. All we can say is that he is at risk due to underlying COPD  Smoking cessation during this time period is imperative  Let his work know but that is a decision for him and his employer.    If continues to work, could wear mask and gloves .  Wash hands frequently , use gel.  Social distancing as much as possible

## 2019-08-26 ENCOUNTER — Telehealth: Payer: Self-pay | Admitting: Adult Health

## 2019-08-26 NOTE — Telephone Encounter (Signed)
Attempted to contact x2, voicemail is full. Patient needs appt, once appt is made we can send in 1 refill until he makes OV per office protocol. Last OV 05/2018.

## 2019-08-29 NOTE — Telephone Encounter (Signed)
ATC pt, voicemail is full. Will try back.

## 2019-09-02 NOTE — Telephone Encounter (Signed)
ATC Patient but the VM was full. Per his chart, he seems to respond better to [employer]. Will send him a message today and close this encounter.

## 2019-09-09 ENCOUNTER — Ambulatory Visit (INDEPENDENT_AMBULATORY_CARE_PROVIDER_SITE_OTHER): Payer: Commercial Managed Care - PPO | Admitting: Adult Health

## 2019-09-09 ENCOUNTER — Encounter: Payer: Self-pay | Admitting: Adult Health

## 2019-09-09 ENCOUNTER — Other Ambulatory Visit: Payer: Self-pay

## 2019-09-09 DIAGNOSIS — J449 Chronic obstructive pulmonary disease, unspecified: Secondary | ICD-10-CM | POA: Diagnosis not present

## 2019-09-09 DIAGNOSIS — F17200 Nicotine dependence, unspecified, uncomplicated: Secondary | ICD-10-CM | POA: Diagnosis not present

## 2019-09-09 MED ORDER — ALBUTEROL SULFATE HFA 108 (90 BASE) MCG/ACT IN AERS: 2.0000 | INHALATION_SPRAY | RESPIRATORY_TRACT | 5 refills | Status: DC | PRN

## 2019-09-09 NOTE — Addendum Note (Signed)
Addended by: Parke Poisson E on: 09/09/2019 04:19 PM   Modules accepted: Orders

## 2019-09-09 NOTE — Progress Notes (Signed)
Virtual Visit via Telephone Note  I connected with Glenn Dean on 09/09/19 at  3:30 PM EDT by telephone and verified that I am speaking with the correct person using two identifiers.  Location: Patient: Home   Provider: Office    I discussed the limitations, risks, security and privacy concerns of performing an evaluation and management service by telephone and the availability of in person appointments. I also discussed with the patient that there may be a patient responsible charge related to this service. The patient expressed understanding and agreed to proceed.   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  He denies any flare of cough or wheezing. Has occasional cough . Gets winded with heavy labor.  Says he remains active.  Works full-time. Wears a mask at work .  Patient does continue to smoke.  We discussed smoking cessation. Patient remains on Symbicort 2 puffs twice daily.   Observations/Objective: PFT 02/24/16: FVC 4.57 L (107%) FEV1 3.40 L (99%) FEV1/FVC 0.74 FEF 25-35 2.56 L (73%) negative bronchodilator response 09/03/15: FVC 4.23 L (99%) FEV1 2.60 L (75%) FEV1/FVC 0.61 FEF 25-75 1.34 L (38%) positive bronchodilator response TLC 6.31 L (90%) RV 92% ERV 144% DLCO uncorrected 79%  09/03/15 ALPHA-1 ANTIRYPSIN: MM (139)  Assessment and Plan: COPD compensated on present regimen Consider chest xray on return  Discussed flu shot   Active smoker- smoking cessation discussed  Plan  Patient Instructions  Continue on Symbicort 160 2 puffs twice daily, rinse after use.  Using Ventolin 2 puffs every 4 hours as needed for cough and wheezing. This is your rescue inhaler. Work on not smoking  Get flu shot .  Follow up Dr. Vaughan Browner in 6 months and As needed       Follow Up Instructions: Follow-up in 1 year and  as needed   I discussed the assessment and treatment plan with the patient. The patient was provided an opportunity to ask questions and all were answered. The patient agreed with the plan and demonstrated an understanding of the instructions.   The patient was advised to call back or seek an in-person evaluation if the symptoms worsen or if the condition fails to improve as anticipated.  I provided 22 minutes of non-face-to-face time during this encounter.   Rexene Edison, NP

## 2019-09-09 NOTE — Patient Instructions (Addendum)
Continue on Symbicort 160 2 puffs twice daily, rinse after use.  Using Ventolin 2 puffs every 4 hours as needed for cough and wheezing. This is your rescue inhaler. Work on not smoking  Get flu shot .  Follow up Dr. Vaughan Browner in 6 months and As needed

## 2020-01-20 NOTE — Progress Notes (Signed)
Virtual Visit via Video Note  I connected with Glenn Dean on 01/20/20 at 10:00 AM EST by a video enabled telemedicine application and verified that I am speaking with the correct person using two identifiers.  Location: Patient: Home Provider: Office   I discussed the limitations of evaluation and management by telemedicine and the availability of in person appointments. The patient expressed understanding and agreed to proceed.  History of Present Illness: 49 year old male, quit smoking December 2020. PMH significant for mild COPD, thursh, insomnia, PTSH. Previous patient of Dr. Jamison Neighbor, he has been following with Rubye Oaks NP- last seen in September 2020. Needs to establish care with Dr. Isaiah Serge. Maintained on Symbicort 160 twice daily, prn ventolin.   01/21/2020 Patient contacted today for acute video visit for shortness of breath with intermittent chest tightness x 2-3 days. States that he QUIT smoking last month. He has been feeling well, therefore, he stopped using Symbicort 160 regularly. He needed to use his Albuterol rescue inhaler 4-6 times over the last couple of days. Works a a city Midwife. He wears a mask and does not have any known covid exposure. He does not feel confident going back to work until he is better. Denies fever, HA, sinus symptoms, cough, change in smell or taste.   Observations/Objective:  - Appears well, able to speak in full sentences - No observed shortness of breath, wheezing or cough   PFT 02/24/16: FVC 4.57 L (107%) FEV1 3.40 L (99%) FEV1/FVC 0.74 FEF 25-35 2.56 L (73%) negative bronchodilator response 09/03/15: FVC 4.23 L (99%) FEV1 2.60 L (75%) FEV1/FVC 0.61 FEF 25-75 1.34 L (38%) positive bronchodilator response TLC 6.31 L (90%) RV 92% ERV 144% DLCO uncorrected 79%  09/03/15 ALPHA-1 ANTIRYPSIN: MM (139)  Assessment and Plan:  COPD - Mild exacerbation x 2 days d/t not taking Symbicort  - Quit smoking 1 month ago and feeling well  -  Recommend lower dose Symbicort 80 1-2 puffs TWICE daily - RX prednisone taper (40mg  x 2 days; 30mg  x 2 days; 20mg  x 2 days x 10mg  x 2 days) - Reinforced importance of taking maintenance inhalers as prescribed   Tobacco abuse - Quit smoking December 2020 - Congratulated and continue to encourage he abstain from use   Follow Up Instructions:   - 2-3 months to establish care with Dr. (previous )  I discussed the assessment and treatment plan with the patient. The patient was provided an opportunity to ask questions and all were answered. The patient agreed with the plan and demonstrated an understanding of the instructions.   The patient was advised to call back or seek an in-person evaluation if the symptoms worsen or if the condition fails to improve as anticipated.  I provided 20 minutes of non-face-to-face time during this encounter.   , NP

## 2020-01-21 ENCOUNTER — Telehealth (INDEPENDENT_AMBULATORY_CARE_PROVIDER_SITE_OTHER): Payer: Commercial Managed Care - PPO | Admitting: Primary Care

## 2020-01-21 ENCOUNTER — Encounter: Payer: Self-pay | Admitting: Primary Care

## 2020-01-21 DIAGNOSIS — J449 Chronic obstructive pulmonary disease, unspecified: Secondary | ICD-10-CM

## 2020-01-21 MED ORDER — BUDESONIDE-FORMOTEROL FUMARATE 80-4.5 MCG/ACT IN AERO: 2.0000 | INHALATION_SPRAY | Freq: Two times a day (BID) | RESPIRATORY_TRACT | 6 refills | Status: DC

## 2020-01-21 MED ORDER — PREDNISONE 10 MG PO TABS: ORAL_TABLET | ORAL | 0 refills | Status: DC

## 2020-01-21 NOTE — Patient Instructions (Addendum)
Recommendations - Use Symbicort 1-2 puffs TWICE daily (Lower dose- this is your steroid/long acting bronchodilator inhaler) - Continue Albuterol 2 puffs every 4-6 hours for breakthrough shortness of breath (this is short acting bronchodilator) - RX prednisone taper  - Will provide note to be out of work until 2/3  Follow-up 2-3 months to establish care with new doctor

## 2020-04-14 ENCOUNTER — Encounter: Payer: Self-pay | Admitting: Pulmonary Disease

## 2020-04-14 ENCOUNTER — Ambulatory Visit (INDEPENDENT_AMBULATORY_CARE_PROVIDER_SITE_OTHER): Payer: Commercial Managed Care - PPO

## 2020-04-14 ENCOUNTER — Other Ambulatory Visit: Payer: Self-pay

## 2020-04-14 ENCOUNTER — Ambulatory Visit: Payer: Commercial Managed Care - PPO | Admitting: Pulmonary Disease

## 2020-04-14 VITALS — BP 120/70 | HR 80 | Temp 97.3°F | Ht 69.0 in | Wt 185.8 lb

## 2020-04-14 DIAGNOSIS — J449 Chronic obstructive pulmonary disease, unspecified: Secondary | ICD-10-CM

## 2020-04-14 DIAGNOSIS — J4489 Other specified chronic obstructive pulmonary disease: Secondary | ICD-10-CM

## 2020-04-14 NOTE — Addendum Note (Signed)
Addended by: Edwina Barth I on: 04/14/2020 09:11 AM   Modules accepted: Orders

## 2020-04-14 NOTE — Progress Notes (Signed)
Glenn Dean    [id number]    [birth year]  Primary Care Physician:Kelly, Lysle Pearl, MD  Referring Physician: Wilburn Mylar, MD [address] [place],  [place] [address]  Chief complaint: Follow-up for COPD, asthma  HPI: 49 year old with mild COPD, asthma overlap.  Maintained on Symbicort. Not sure if he is taking this on a regular basis based on previous notes but per patient he has not missed any day but is taking her Symbicort once a day Seen in in January for mild exacerbation which was treated with a prednisone taper  States that he continues to have intermittent dyspnea, chest tightness that wakes him up at night at least 2 times a a month.  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  No down pillows or comforters Smoking history: 6-pack-year smoking history.  Quit in January 2021 Travel history: Originally from [place]. He has traveled to [place], [place], [place], [place], [place], [place], [place], [place], [place], [place], [place], & [place]. He served in [employer] and has been to [place] & middle [place], [place], [place], [place], [place], & [place].  Relevant family history: No significant family history of lung disease   Outpatient Encounter Medications as of 04/14/2020  Medication Sig  . albuterol (ACCUNEB) 1.25 MG/3ML nebulizer solution INHALE 1 VIAL VIA NEBULIZER EVERY 6 (SIX) HOURS AS NEEDED FOR WHEEZING.  Marland Kitchen albuterol (VENTOLIN HFA) 108 (90 Base) MCG/ACT inhaler Inhale 2 puffs into the lungs every 4 (four) hours as needed for wheezing or shortness of breath.  . budesonide-formoterol (SYMBICORT) 80-4.5 MCG/ACT inhaler Inhale 2 puffs into the lungs 2 (two) times daily at 10 AM and 5 PM.  . buPROPion (ZYBAN) 150 MG 12 hr tablet Take 150 mg by mouth 2 (two) times daily.  . clonazePAM (KLONOPIN) 0.5 MG tablet Take 0.5 mg  by mouth 2 (two) times daily as needed for anxiety.  Marland Kitchen Spacer/Aero Chamber Mouthpiece MISC 1 Device as directed by Does not apply route.  . traZODone (DESYREL) 50 MG tablet Take 50 mg by mouth at bedtime.  . [DISCONTINUED] predniSONE (DELTASONE) 10 MG tablet Take 4 tabs po daily x 2 days; then 3 tabs for 2 days; then 2 tabs for 2 days; then 1 tab for 2 days   No facility-administered encounter medications on file as of 04/14/2020.    Allergies as of 04/14/2020 - Review Complete 04/14/2020  Allergen Reaction Noted  . Norco [hydrocodone-acetaminophen] Itching 10/27/2014    Past Medical History:  Diagnosis Date  . Arthritis   . COPD (chronic obstructive pulmonary disease) (HCC)   . Depression   . H/O spinal cord injury   . PTSD (post-traumatic stress disorder)     Past Surgical History:  Procedure Laterality Date  . TOTAL HIP ARTHROPLASTY Left 11/04/2014   Procedure: LEFT TOTAL HIP ARTHROPLASTY ANTERIOR APPROACH;  Surgeon: Loanne Drilling, MD;  Location: [facility];  Service: Orthopedics;  Laterality: Left;    Family History  Problem Relation Age of Onset  . Diabetes Mother   . Hypertension Mother   . Diabetes Father   . Hypertension Father   . Congestive Heart Failure Maternal Grandmother   . Asthma Son   . Allergies Son   . Allergies Daughter   . Rheumatologic disease Neg Hx  Social History   Socioeconomic History  . Marital status: Married    Spouse name: Not on file  . Number of children: Not on file  . Years of education: Not on file  . Highest education level: Not on file  Occupational History  . Occupation: bus Mining engineer  Tobacco Use  . Smoking status: Former Smoker    Packs/day: 0.25    Years: 25.00    Pack years: 6.25    Types: Cigarettes    Start date: 12/25/1988    Quit date: 01/15/2020    Years since quitting: 0.2  . Smokeless tobacco: Never Used  . Tobacco comment: uses vape & peak rate of 1/2ppd  Substance and Sexual Activity  . Alcohol use: Yes     Alcohol/week: 8.0 standard drinks    Types: 8 Cans of beer per week    Comment: per week  . Drug use: No  . Sexual activity: Not on file  Other Topics Concern  . Not on file  Social History Narrative   Originally from [place]. He has traveled to [place], [place], [place], [place], [place], [place], [place], [place], [place], [place], [place], & [place]. He served in [employer] and has been to [place], [place], [place], [place], [place], & [place]. 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 He has a dog at home. No bird, mold, or hot tub exposure. Enjoys fishing and shoot firearms.    Social Determinants of Health   Financial Resource Strain:   . Difficulty of Paying Living Expenses:   Food Insecurity:   . Worried About Charity fundraiser in the Last Year:   . Arboriculturist in the Last Year:   Transportation Needs:   . Film/video editor (Medical):   Marland Kitchen Lack of Transportation (Non-Medical):   Physical Activity:   . Days of Exercise per Week:   . Minutes of Exercise per Session:   Stress:   . Feeling of Stress :   Social Connections:   . Frequency of Communication with Friends and Family:   . Frequency of Social Gatherings with Friends and Family:   . Attends Religious Services:   . Active Member of Clubs or Organizations:   . Attends Archivist Meetings:   Marland Kitchen Marital Status:   Intimate Partner Violence:   . Fear of Current or Ex-Partner:   . Emotionally Abused:   Marland Kitchen Physically Abused:   . Sexually Abused:     Review of systems: Review of Systems  Constitutional: Negative for fever and chills.  HENT: Negative.   Eyes: Negative for blurred vision.  Respiratory: as per HPI  Cardiovascular: Negative for chest pain and palpitations.  Gastrointestinal: Negative for vomiting, diarrhea, blood per rectum. Genitourinary: Negative for dysuria, urgency, frequency and  hematuria.  Musculoskeletal: Negative for myalgias, back pain and joint pain.  Skin: Negative for itching and rash.  Neurological: Negative for dizziness, tremors, focal weakness, seizures and loss of consciousness.  Endo/Heme/Allergies: Negative for environmental allergies.  Psychiatric/Behavioral: Negative for depression, suicidal ideas and hallucinations.  All other systems reviewed and are negative.  Physical Exam: Blood pressure 120/70, pulse 80, temperature (!) 97.3 F (36.3 C), temperature source Temporal, height 5\' 9"  (1.753 m), weight 185 lb 12.8 oz (84.3 kg), SpO2 98 %. Gen:      No acute distress HEENT:  EOMI,  sclera anicteric Neck:     No masses; no thyromegaly Lungs:    Clear to auscultation bilaterally; normal respiratory effort CV:         Regular rate and rhythm; no murmurs Abd:      + bowel sounds; soft, non-tender; no palpable masses, no distension Ext:    No edema; adequate peripheral perfusion Skin:      Warm and dry; no rash Neuro: alert and oriented x 3 Psych: normal mood and affect  Data Reviewed: Imaging: Chest x-ray 09/14/2017-no acute cardiopulmonary disease.  PFTs: 09/03/2015 FVC 4.36 [102%], FEV1 3.08 [89%], F/F 71, TLC 6.31 [90%], DLCO 25.79 [at 9%]  02/24/2016 FVC 4.51 [105%], FEV1 3.32 [96%], F/F 74  07/14/2017 FVC 4.0 [9 9%], FEV1 2.4 [72%], F/F 59 Mild airway obstruction, bronchodilator response  FENO 09/14/2017-224  Labs: Alpha-1 antitrypsin 09/03/2015-139, PI MM  Assessment:  COPD, asthma overlap syndrome Overall has poor control of her symptoms. Inhaler regimen needs to be optimized.  He is having trouble affording Symbicort due to $300 co-pay We will refer to pharmacy for inhaler training, insurance coverage check.  Check CBC differential, IgE and chest x-ray for baseline assessment Consider Biologics if he continues to be symptomatic after optimizing inhalers.  Plan/Recommendations: Continue Symbicort, pharmacy referral CBC, IgE, chest  x-ray  Chilton Greathouse MD [facility] 04/14/2020, 8:36 AM  CC: Wilburn Mylar, MD

## 2020-04-14 NOTE — Patient Instructions (Signed)
We will get a chest x-ray, CBC differential and IgE today Continue the Symbicort for now.  We will make referral to pharmacy to check insurance coverage, inhaler technique training  Follow-up in 3 months.

## 2020-04-19 ENCOUNTER — Telehealth: Payer: Self-pay | Admitting: Pulmonary Disease

## 2020-04-20 NOTE — Telephone Encounter (Signed)
Completed paperwork and returned

## 2020-04-20 NOTE — Telephone Encounter (Signed)
Dr. Isaiah Serge have you received or completed paperwork on this patient.

## 2020-04-21 NOTE — Telephone Encounter (Signed)
Was this sent Patrice? Thanks

## 2020-04-21 NOTE — Telephone Encounter (Signed)
Rec'd completed paperwork back - fwd to [employer] via interoffice mail -pr  °

## 2020-04-23 ENCOUNTER — Telehealth: Payer: Self-pay | Admitting: Pulmonary Disease

## 2020-04-23 NOTE — Telephone Encounter (Signed)
Spoke with patient. He stated that he was following up on the Symbicort copay help from pharmacy. I advised him that Dr. Isaiah Serge wanted him to get an appt with the pharmacy. He verbalized understanding. I was able to get him scheduled with pharmacy on May 24th at 320pm.   He also wanted to know if his FMLA paperwork had been signed. I advised him that the paperwork has been signed and faxed back to [employer]. I provided him with [employer]'s phone number.   Nothing further needed at time of call.

## 2020-05-14 NOTE — Progress Notes (Signed)
 This encounter was created in error - please disregard.

## 2020-05-17 MED ORDER — FLUTICASONE-SALMETEROL 100-50 MCG/DOSE IN AEPB: 1.0000 | INHALATION_SPRAY | Freq: Two times a day (BID) | RESPIRATORY_TRACT | 11 refills | Status: DC

## 2020-09-12 IMAGING — DX DG CHEST 2V
2 series · 2 of 2 positions shown · non-contrast
Comparison: 09/14/2017

CLINICAL DATA: Dyspnea

EXAM:
CHEST - 2 VIEW

[chest pa]
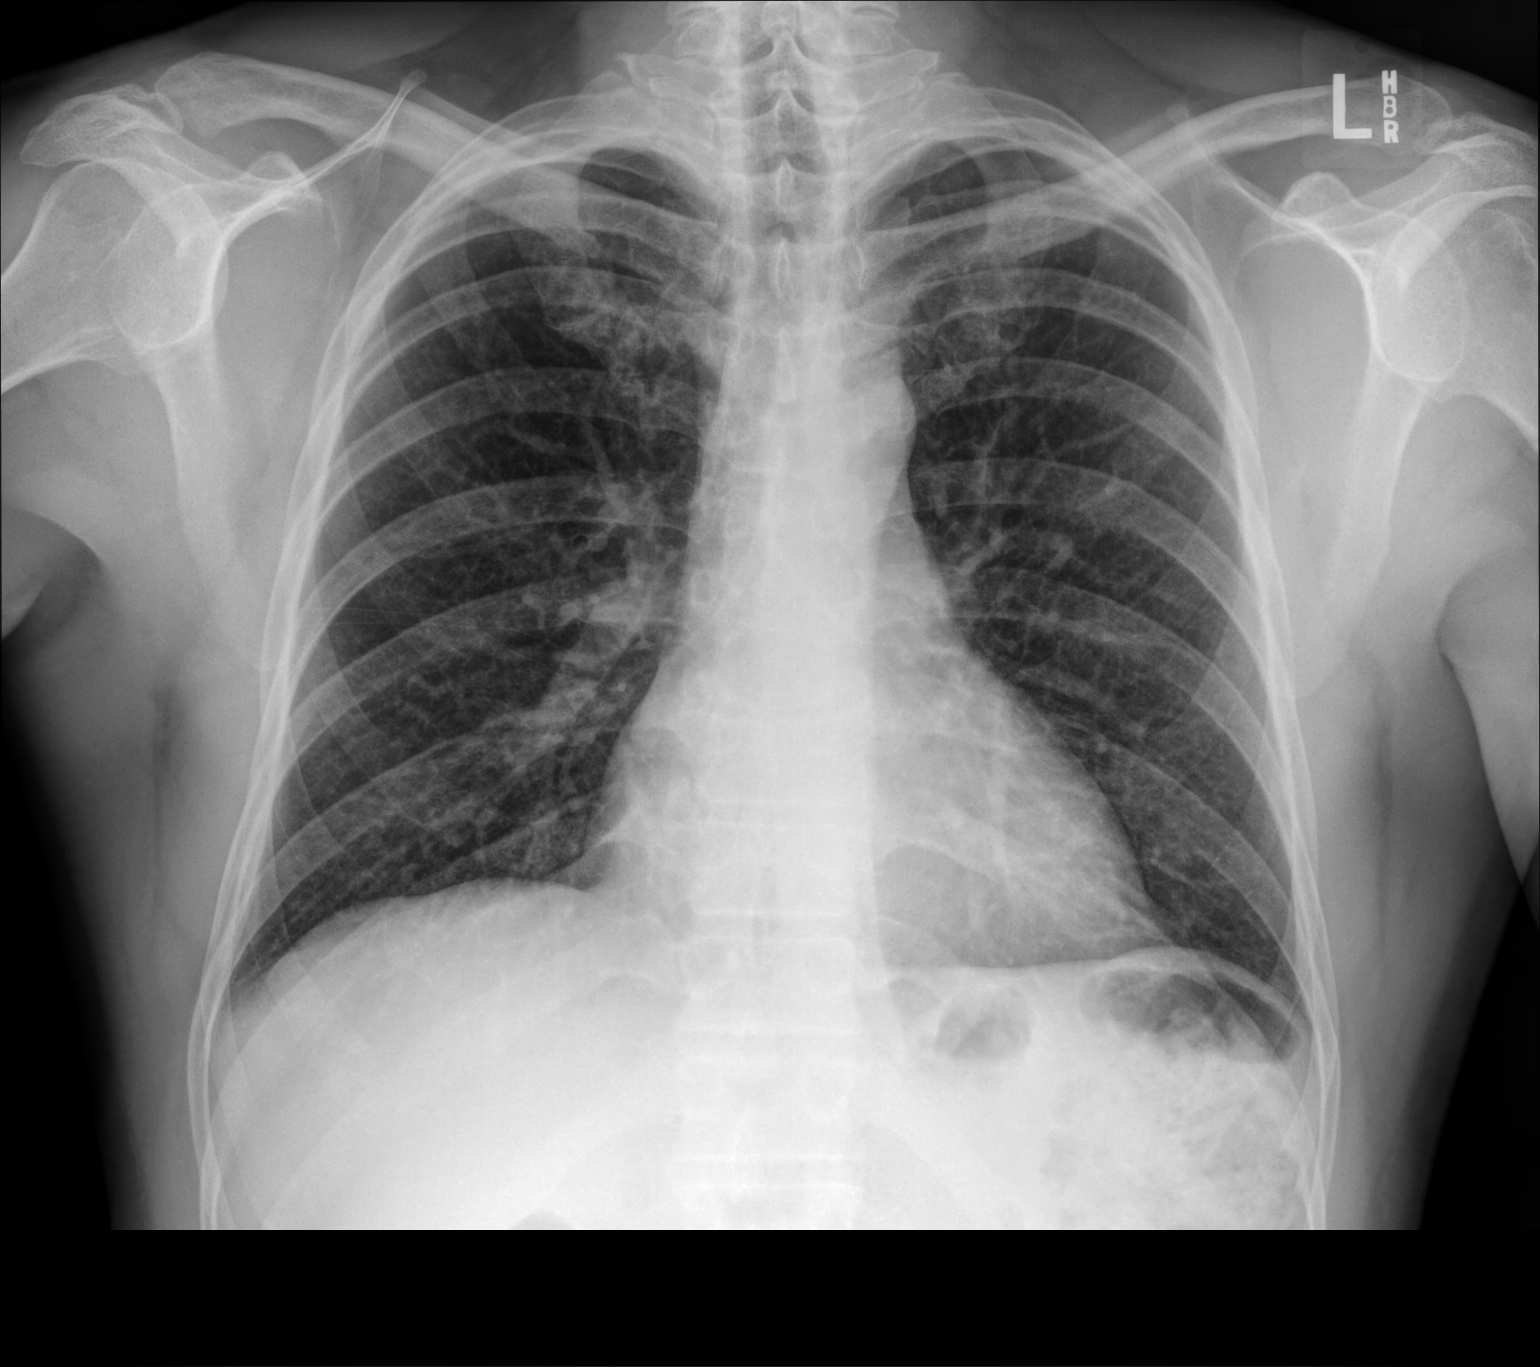

[chest lat]
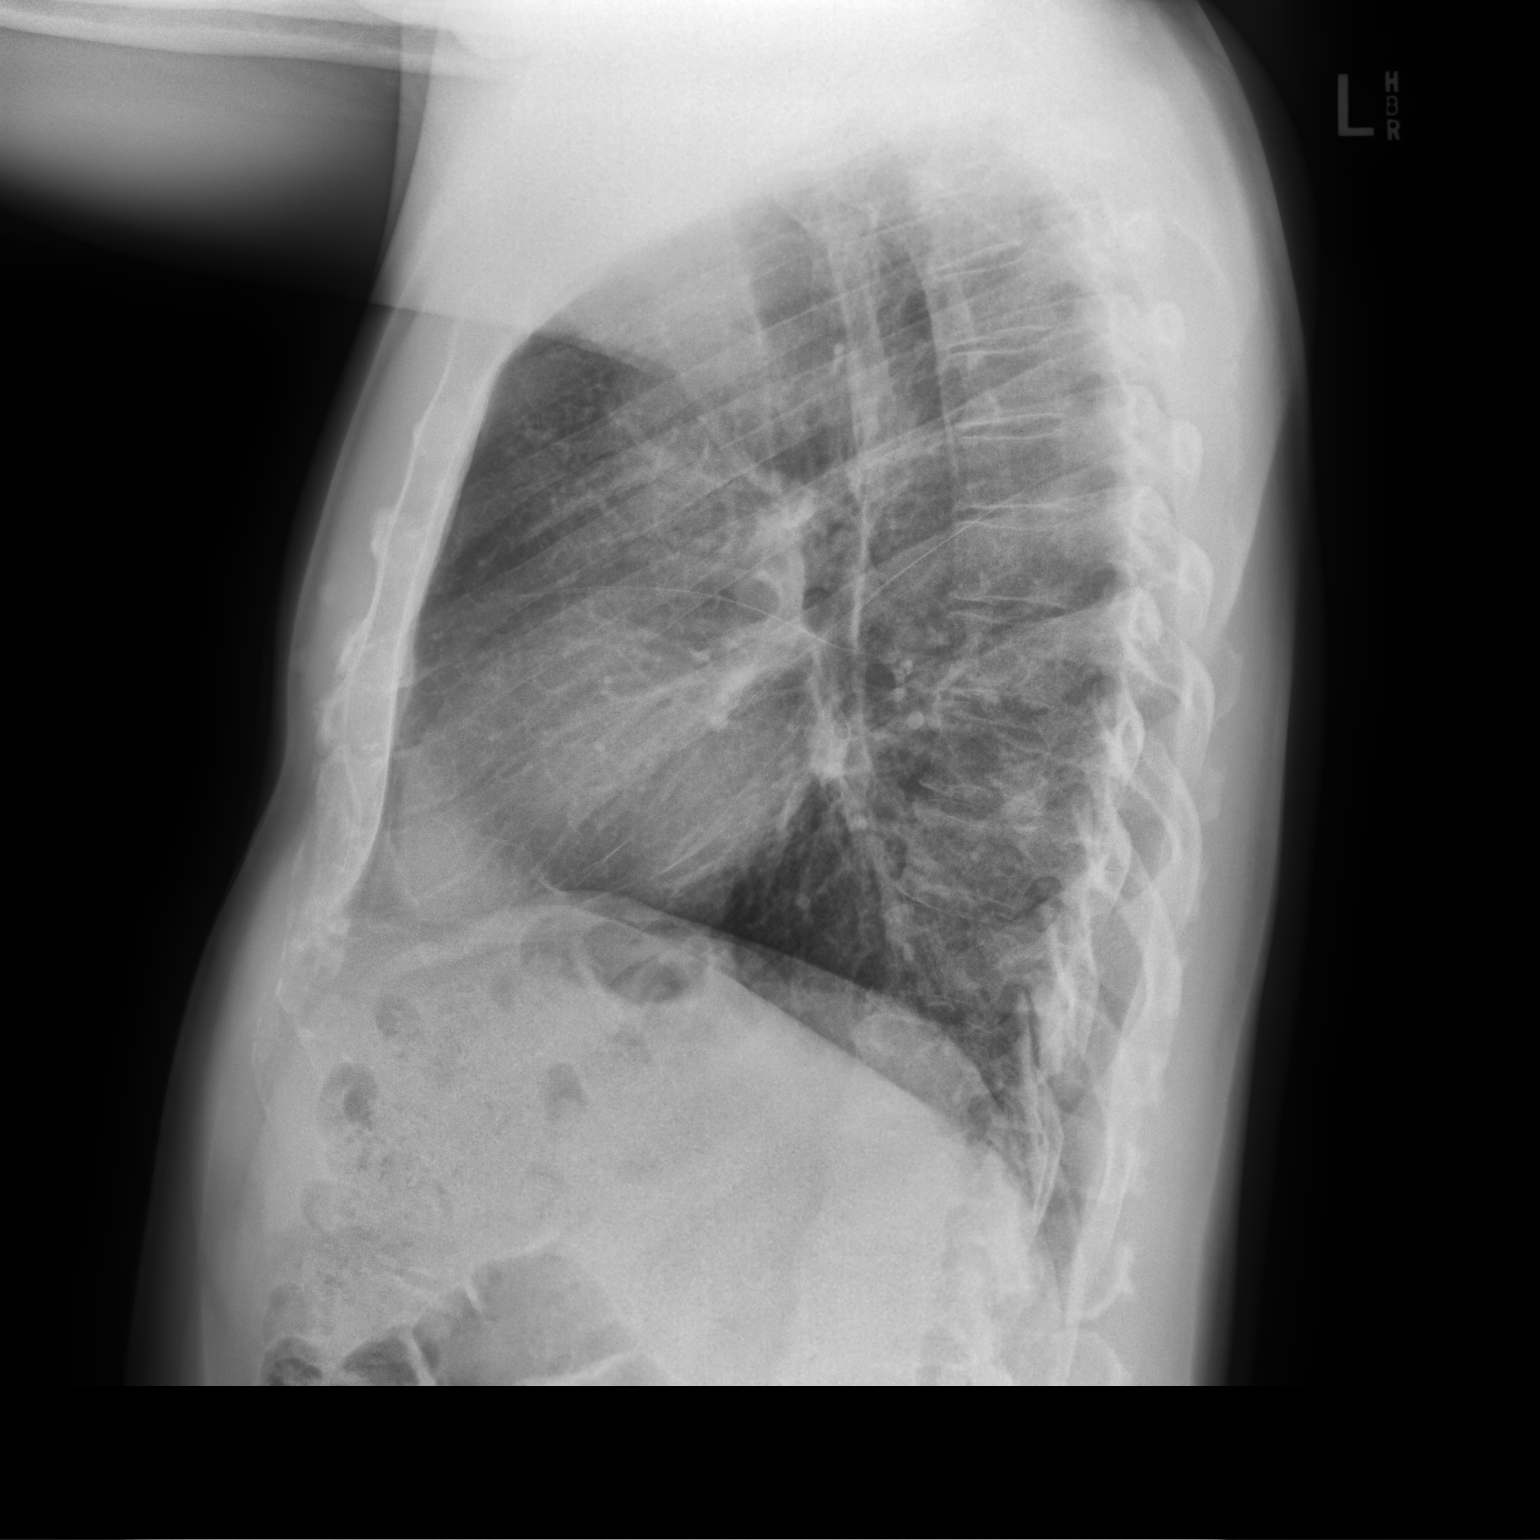

[2 of 2 positions shown; findings below may reference images not displayed]

FINDINGS: The heart size and mediastinal contours are within normal limits. No
focal airspace consolidation, pleural effusion, or pneumothorax. The
visualized skeletal structures are unremarkable.
IMPRESSION: No active cardiopulmonary disease.

## 2020-11-04 ENCOUNTER — Telehealth: Payer: Self-pay | Admitting: Pulmonary Disease

## 2020-11-04 NOTE — Telephone Encounter (Signed)
Spoke with the pt  He states needing FMLA forms filled out and so I advised him he could drop them off and we will let him know oince they have been completed Nothing further needed

## 2020-11-16 ENCOUNTER — Telehealth: Payer: Self-pay | Admitting: Pulmonary Disease

## 2020-11-16 NOTE — Telephone Encounter (Signed)
Rec'd fmla paperwork - will process and give to Dr. Isaiah Serge to sign. I have also called and left the patient a message to advise a follow up appt is needed. -pr

## 2020-11-19 NOTE — Telephone Encounter (Signed)
Rec'd signed forms back - faxed to [phone] -pr

## 2020-12-03 ENCOUNTER — Other Ambulatory Visit (INDEPENDENT_AMBULATORY_CARE_PROVIDER_SITE_OTHER): Payer: Commercial Managed Care - PPO

## 2020-12-03 ENCOUNTER — Other Ambulatory Visit: Payer: Self-pay

## 2020-12-03 ENCOUNTER — Ambulatory Visit: Payer: Commercial Managed Care - PPO | Admitting: Pulmonary Disease

## 2020-12-03 ENCOUNTER — Encounter: Payer: Self-pay | Admitting: Pulmonary Disease

## 2020-12-03 VITALS — BP 130/80 | HR 91 | Temp 97.8°F | Ht 69.0 in | Wt 189.2 lb

## 2020-12-03 DIAGNOSIS — J449 Chronic obstructive pulmonary disease, unspecified: Secondary | ICD-10-CM | POA: Diagnosis not present

## 2020-12-03 LAB — CBC WITH DIFFERENTIAL/PLATELET
Basophils Absolute: 0 10*3/uL (ref 0.0–0.1)
Basophils Relative: 0.7 % (ref 0.0–3.0)
Eosinophils Absolute: 0.1 10*3/uL (ref 0.0–0.7)
Eosinophils Relative: 1.9 % (ref 0.0–5.0)
HCT: 42.9 % (ref 39.0–52.0)
Hemoglobin: 14.6 g/dL (ref 13.0–17.0)
Lymphocytes Relative: 31.6 % (ref 12.0–46.0)
Lymphs Abs: 1.4 10*3/uL (ref 0.7–4.0)
MCHC: 33.9 g/dL (ref 30.0–36.0)
MCV: 92.8 fl (ref 78.0–100.0)
Monocytes Absolute: 0.4 10*3/uL (ref 0.1–1.0)
Monocytes Relative: 9.7 % (ref 3.0–12.0)
Neutro Abs: 2.6 10*3/uL (ref 1.4–7.7)
Neutrophils Relative %: 56.1 % (ref 43.0–77.0)
Platelets: 255 10*3/uL (ref 150.0–400.0)
RBC: 4.63 Mil/uL (ref 4.22–5.81)
RDW: 13.8 % (ref 11.5–15.5)
WBC: 4.6 10*3/uL (ref 4.0–10.5)

## 2020-12-03 MED ORDER — AIRDUO DIGIHALER 232-14 MCG/ACT IN AEPB: 1.0000 | INHALATION_SPRAY | Freq: Two times a day (BID) | RESPIRATORY_TRACT | 3 refills | Status: DC

## 2020-12-03 NOTE — Addendum Note (Signed)
Addended by: Cleda Mccreedy F on: 12/03/2020 04:11 PM   Modules accepted: Orders

## 2020-12-03 NOTE — Progress Notes (Signed)
TRACIE LINDBLOOM    [id number]    1971-08-13  Primary Care Physician:Patient, No Pcp Per  Referring Physician: Wilburn Mylar, MD No address on file  Chief complaint: Follow-up for COPD, asthma  HPI: 49 year old with mild COPD, asthma overlap.  Maintained on Symbicort. Not sure if he is taking this on a regular basis based on previous notes but per patient he has not missed any day but is taking her Symbicort once a day Seen in in January for mild exacerbation which was treated with a prednisone taper  States that he continues to have intermittent dyspnea, chest tightness that wakes him up at night at least 2 times a a month.  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  Currently works as a Midwife for the [employer] Exposures: No mold, hot tub, Jacuzzi.  No down pillows or comforters Smoking history: 6-pack-year smoking history.  Quit in January 2021 Travel history: Originally from [place]. He has traveled to [place], [place], [place], [place], [place], [place], [place], [place], [place], [place], [place], & [place]. He served in [employer] and has been to [place], [place], [place], [place], [place], & [place].  Relevant family history: No significant family history of lung disease  Interim history: Continues to have daily symptoms of dyspnea, wheezing.  He is using his albuterol mostly Hardly using his Symbicort due to cost of medication  He has not kept his pharmacy appointment earlier this year for inhaler training and we are unable to reschedule as the pharmacist has left the practice  Outpatient Encounter Medications as of 12/03/2020  Medication Sig  . albuterol (VENTOLIN HFA) 108 (90 Base) MCG/ACT inhaler Inhale 2 puffs into the lungs every 4 (four) hours as needed for wheezing or shortness of breath.  . Spacer/Aero Chamber Mouthpiece  MISC 1 Device as directed by Does not apply route.  . traZODone (DESYREL) 50 MG tablet Take 50 mg by mouth at bedtime.  Marland Kitchen albuterol (ACCUNEB) 1.25 MG/3ML nebulizer solution INHALE 1 VIAL VIA NEBULIZER EVERY 6 (SIX) HOURS AS NEEDED FOR WHEEZING. (Patient not taking: Reported on 12/03/2020)  . budesonide-formoterol (SYMBICORT) 80-4.5 MCG/ACT inhaler Inhale 2 puffs into the lungs 2 (two) times daily at 10 AM and 5 PM. (Patient not taking: Reported on 12/03/2020)  . buPROPion (ZYBAN) 150 MG 12 hr tablet Take 150 mg by mouth 2 (two) times daily.  . clonazePAM (KLONOPIN) 0.5 MG tablet Take 0.5 mg by mouth 2 (two) times daily as needed for anxiety.  . Fluticasone-Salmeterol (WIXELA INHUB) 100-50 MCG/DOSE AEPB Inhale 1 puff into the lungs in the morning and at bedtime. (Patient not taking: Reported on 12/03/2020)   No facility-administered encounter medications on file as of 12/03/2020.   Physical Exam: Blood pressure 130/80, pulse 91, temperature 97.8 F (36.6 C), height 5\' 9"  (1.753 m), weight 189 lb 3.2 oz (85.8 kg), SpO2 97 %. Gen:      No acute distress HEENT:  EOMI, sclera anicteric Neck:     No masses; no thyromegaly Lungs:    Clear to auscultation bilaterally; normal respiratory effort CV:         Regular rate and rhythm; no murmurs Abd:      + bowel sounds; soft, non-tender; no palpable masses, no distension Ext:    No edema; adequate peripheral perfusion Skin:  Warm and dry; no rash Neuro: alert and oriented x 3 Psych: normal mood and affect  Data Reviewed: Imaging: Chest x-ray 09/14/2017- no active cardiopulmonary disease. Chest x-ray 04/14/2020-no active cardiopulmonary disease. I have reviewed the images personally.  PFTs: 09/03/2015 FVC 4.36 [102%], FEV1 3.08 [89%], F/F 71, TLC 6.31 [90%], DLCO 25.79 [at 9%]  02/24/2016 FVC 4.51 [105%], FEV1 3.32 [96%], F/F 74  07/14/2017 FVC 4.0 [9 9%], FEV1 2.4 [72%], F/F 59 Mild airway obstruction, bronchodilator response  FENO  09/14/2017-224  Labs: Alpha-1 antitrypsin 09/03/2015-139, PI MM  Assessment:  COPD, asthma overlap syndrome Overall has poor control of her symptoms. Inhaler regimen needs to be optimized.  He is having trouble affording Symbicort due to $300 co-pay  Switch inhalers to generic airduo. which he will try with the GoodRx card. Start Singulair 10 mg at night CBC differential, IgE Consider Biologics if he continues to be symptomatic after optimizing inhalers.  Plan/Recommendations: Continue Symbicort, pharmacy referral CBC, IgE, chest x-ray  Chilton Greathouse MD [facility] 12/03/2020, 3:40 PM  CC: Wilburn Mylar, MD

## 2020-12-03 NOTE — Addendum Note (Signed)
Addended by: Dorisann Frames R on: 12/03/2020 04:01 PM   Modules accepted: Orders

## 2020-12-03 NOTE — Addendum Note (Signed)
Addended by: Demetrio Lapping E on: 12/03/2020 04:05 PM   Modules accepted: Orders

## 2020-12-03 NOTE — Patient Instructions (Signed)
We will prescribe AirDuo RespiClick 232/14 1 puff twice daily Start Singulair 10 mg at night Check CBC differential, IgE  Follow-up in 3 months.

## 2020-12-06 LAB — IGE: IgE (Immunoglobulin E), Serum: 421 kU/L — ABNORMAL HIGH (ref <=114)

## 2022-01-25 ENCOUNTER — Encounter: Payer: Self-pay | Admitting: Family Medicine

## 2022-01-25 ENCOUNTER — Other Ambulatory Visit: Payer: Self-pay

## 2022-01-25 ENCOUNTER — Ambulatory Visit (INDEPENDENT_AMBULATORY_CARE_PROVIDER_SITE_OTHER): Payer: Commercial Managed Care - PPO | Admitting: Family Medicine

## 2022-01-25 VITALS — BP 130/70 | HR 74 | Temp 98.4°F | Ht 69.0 in | Wt 200.4 lb

## 2022-01-25 DIAGNOSIS — F4310 Post-traumatic stress disorder, unspecified: Secondary | ICD-10-CM | POA: Diagnosis not present

## 2022-01-25 DIAGNOSIS — M5441 Lumbago with sciatica, right side: Secondary | ICD-10-CM

## 2022-01-25 DIAGNOSIS — N528 Other male erectile dysfunction: Secondary | ICD-10-CM

## 2022-01-25 DIAGNOSIS — K219 Gastro-esophageal reflux disease without esophagitis: Secondary | ICD-10-CM

## 2022-01-25 DIAGNOSIS — G8929 Other chronic pain: Secondary | ICD-10-CM

## 2022-01-25 DIAGNOSIS — J449 Chronic obstructive pulmonary disease, unspecified: Secondary | ICD-10-CM | POA: Diagnosis not present

## 2022-01-25 DIAGNOSIS — M5442 Lumbago with sciatica, left side: Secondary | ICD-10-CM

## 2022-01-25 NOTE — Patient Instructions (Addendum)
Welcome to [facility]! It was a pleasure meeting you today.  As discussed, Please schedule a 6 month follow up visit today.  Tiotroprium(spiriva). Consider adding-call pulmonologist at [facility] or primary care [facility] rather than vi[facility] as congestion Take famotidine twice/day.   PLEASE NOTE:  If you had any LAB tests please let us know if you have not heard back within a few days. You may see your results on MyChart before we have a chance to review them but we will give you a call once they are reviewed by [place]. If we ordered any REFERRALS today, please let us know if you have not heard from their office within the next week.  Let us know through MyChart if you are needing REFILLS, or have your pharmacy send [place] the request. You can also use MyChart to communicate with me or any office staff.  Please try these tips to maintain a healthy lifestyle:  Eat most of your calories during the day when you are active. Eliminate processed foods including packaged sweets (pies, cakes, cookies), reduce intake of potatoes, white bread, white pasta, and white rice. Look for whole grain options, oat flour or almond flour.  Each meal should contain half fruits/vegetables, one quarter protein, and one quarter carbs (no bigger than a computer mouse).  Cut down on sweet beverages. This includes juice, soda, and sweet tea. Also watch fruit intake, though this is a healthier sweet option, it still contains natural sugar! Limit to 3 servings daily.  Drink at least 1 glass of water with each meal and aim for at least 8 glasses per day  Exercise at least 150 minutes every week.
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
## 2022-03-21 ENCOUNTER — Ambulatory Visit (INDEPENDENT_AMBULATORY_CARE_PROVIDER_SITE_OTHER): Payer: Commercial Managed Care - PPO

## 2022-03-21 ENCOUNTER — Other Ambulatory Visit: Payer: Self-pay

## 2022-03-21 ENCOUNTER — Ambulatory Visit: Payer: Commercial Managed Care - PPO | Admitting: Adult Health

## 2022-03-21 ENCOUNTER — Encounter: Payer: Self-pay | Admitting: Adult Health

## 2022-03-21 VITALS — BP 136/72 | HR 71 | Temp 97.9°F | Ht 69.0 in | Wt 197.4 lb

## 2022-03-21 DIAGNOSIS — R059 Cough, unspecified: Secondary | ICD-10-CM | POA: Diagnosis not present

## 2022-03-21 DIAGNOSIS — J449 Chronic obstructive pulmonary disease, unspecified: Secondary | ICD-10-CM

## 2022-03-21 LAB — POC COVID19 BINAXNOW: SARS Coronavirus 2 Ag: NEGATIVE

## 2022-03-21 MED ORDER — ALBUTEROL SULFATE HFA 108 (90 BASE) MCG/ACT IN AERS: 2.0000 | INHALATION_SPRAY | RESPIRATORY_TRACT | 5 refills | Status: DC | PRN

## 2022-03-21 MED ORDER — ALBUTEROL SULFATE 1.25 MG/3ML IN NEBU: INHALATION_SOLUTION | RESPIRATORY_TRACT | 3 refills | Status: AC

## 2022-03-21 MED ORDER — PREDNISONE 10 MG PO TABS: ORAL_TABLET | ORAL | 0 refills | Status: DC

## 2022-03-21 NOTE — Progress Notes (Signed)
? ?@Patient  ID: , male    DOB: June 16, 1971, 51 y.o.   MRN: [id number] ? ?Chief Complaint  ?Patient presents with  ? Follow-up  ? ? ?Referring provider: ?[id number], MD ? ?HPI: ?51 year old male followed for mild COPD with asthma overlap ? ?TEST/EVENTS :  ?Pets: No pets, no birds ?Occupation: He worked with [employer]. 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  No down pillows or comforters ?Smoking history: 6-pack-year smoking history.  Quit in January 2021 ?Travel history: Originally from February 2021. He has traveled to [place], [place], [place], [place], [place], [place], [place], [place], [place], [place], [place], & [place]. He served in [place] and has been to [employer] & middle [place], [place], [place], [place], & [place].  ?Relevant family history: No significant family history of lung disease ? ?03/21/2022 Follow up ; COPD with asthma ?Patient presents for a follow-up visit.  Last seen December 2021.  Patient has underlying COPD with asthma overlap.  He is maintained on Wixela  Twice daily  .  Has an albuterol inhaler to use for rescue.  Complains of 2 days of increased cough, wheezing, hoarseness. No fever or body aches. Increased use  over last 2 days.  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  ?No one at home is sick.  ?Had Covid infection 2022, treated self with otc meds only .  ?Fully vaccinated for Covid . No flu shot this year.  ?Covid test in office today is negative  ? ? ? ? ? ?Allergies  ?Allergen Reactions  ? Hydrocodone Itching  ? Norco [Hydrocodone-Acetaminophen] Itching  ?  Nightmares.   ? ? ?Immunization History  ?Administered Date(s) Administered  ? Influenza,inj,Quad PF,6+ Mos 09/14/2019, 09/14/2019  ? Influenza,inj,Quad PF,6-35 Mos 08/26/2019  ? Influenza-Unspecified 08/26/2019  ? Moderna Sars-Covid-2 Vaccination  03/02/2020, 03/30/2020, 12/21/2020  ? Tdap 08/02/2010, 08/02/2010, 10/26/2011  ? ? ?Past Medical History:  ?Diagnosis Date  ? Arthritis   ? Asthma   ? COPD (chronic obstructive pulmonary disease) (HCC)   ? Depression   ? H/O spinal cord injury   ? PTSD (post-traumatic stress disorder)   ? ? ?Tobacco History: ?Social History  ? ?Tobacco Use  ?Smoking Status Former  ? Packs/day: 0.25  ? Years: 25.00  ? Pack years: 6.25  ? Types: Cigarettes  ? Start date: 12/25/1988  ? Quit date: 01/15/2020  ? Years since quitting: 2.1  ?Smokeless Tobacco Never  ?Tobacco Comments  ? uses vape & peak rate of 1/2ppd  ? ?Counseling given: Not Answered ?Tobacco comments: uses vape & peak rate of 1/2ppd ? ? ?Outpatient Medications Prior to Visit  ?Medication Sig Dispense Refill  ? Carboxymethylcellulose Sodium 0.25 % SOLN INSTILL 1 DROP IN EACH EYE 2-4 TIMES DAILY CONTACT PHARMACY WHEN NEW SUPPLY IS NEEDED    ? DULoxetine (CYMBALTA) 60 MG capsule Take 60 mg by mouth daily.    ? famotidine (PEPCID) 20 MG tablet TAKE ONE TABLET BY MOUTH AT BEDTIME FOR GERD    ? fluticasone-salmeterol (ADVAIR) 250-50 MCG/ACT AEPB INHALE 1 INHALATION BY MOUTH TWICE A DAY (RINSE MOUTH WELL WITH WATER AFTER EACH USE)    ? ketotifen (ZADITOR) 0.025 % ophthalmic solution INSTILL 1 DROP IN EACH EYE 1-2 TIMES A DAY    ? meloxicam (MOBIC) 15 MG tablet Take 1 tablet  by mouth daily as needed.    ? sildenafil (VIAGRA) 100 MG tablet TAKE ONE TABLET BY MOUTH AS INSTRUCTED (TAKE 1 HOUR PRIOR TO SEXUAL ACTIVITY *DO NOT EXCEED 1 DOSE PER 24 HOUR PERIOD*)    ? Spacer/Aero Chamber Mouthpiece MISC 1 Device as directed by Does not apply route. 1 each 0  ? traZODone (DESYREL) 50 MG tablet Take 50 mg by mouth at bedtime.    ? albuterol (ACCUNEB) 1.25 MG/3ML nebulizer solution INHALE 1 VIAL VIA NEBULIZER EVERY 6 (SIX) HOURS AS NEEDED FOR WHEEZING. 150 mL 0  ? albuterol (VENTOLIN HFA) 108 (90 Base) MCG/ACT inhaler Inhale 2 puffs into the lungs every 4 (four) hours as needed for wheezing  or shortness of breath. 8 g 5  ? clonazePAM (KLONOPIN) 0.5 MG tablet Take 0.5 mg by mouth at bedtime. (Patient not taking: Reported on 03/21/2022)    ? ?No facility-administered medications prior to visit.  ? ? ? ?Review of Systems:  ? ?Constitutional:   No  weight loss, night sweats,  Fevers, chills,  ?+fatigue, or  lassitude. ? ?HEENT:   No headaches,  Difficulty swallowing,  Tooth/dental problems, or  Sore throat,  ?              No sneezing, itching, ear ache,  ?+nasal congestion, post nasal drip, positive hoarseness ? ?CV:  No chest pain,  Orthopnea, PND, swelling in lower extremities, anasarca, dizziness, palpitations, syncope.  ? ?GI  No heartburn, indigestion, abdominal pain, nausea, vomiting, diarrhea, change in bowel habits, loss of appetite, bloody stools.  ? ?Resp:  No chest wall deformity ? ?Skin: no rash or lesions. ? ?GU: no dysuria, change in color of urine, no urgency or frequency.  No flank pain, no hematuria  ? ?MS:  No joint pain or swelling.  No decreased range of motion.  No back pain. ? ? ? ?Physical Exam ? ?BP 136/72 (BP Location: Left Arm, Cuff Size: Normal)   Pulse 71   Temp 97.9 ?F (36.6 ?C) (Temporal)   Ht 5\' 9"  (1.753 m)   Wt 197 lb 6.4 oz (89.5 kg)   SpO2 98%   BMI 29.15 kg/m?  ? ?GEN: A/Ox3; pleasant , NAD, well nourished  ?  ?HEENT:  /AT,  NOSE-clear drainage , THROAT-clear, no lesions, no postnasal drip or exudate noted.  ? ?NECK:  Supple w/ fair ROM; no JVD; normal carotid impulses w/o bruits; no thyromegaly or nodules palpated; no lymphadenopathy.   ? ?RESP  Clear  P & A; w/o, wheezes/ rales/ or rhonchi. no accessory muscle use, no dullness to percussion ? ?CARD:  RRR, no m/r/g, no peripheral edema, pulses intact, no cyanosis or clubbing. ? ?GI:   Soft & nt; nml bowel sounds; no organomegaly or masses detected.  ? ?Musco: Warm bil, no deformities or joint swelling noted.  ? ?Neuro: alert, no focal deficits noted.   ? ?Skin: Warm, no lesions or rashes ? ? ? ?Lab  Results: ? ? ? ?BNP ?No results found for: BNP ? ?ProBNP ?No results found for: PROBNP ? ?Imaging: ?No results found. ? ? ? ? ?  Latest Ref Rng & Units 02/24/2016  ?  2:19 PM 09/03/2015  ?  2:05 PM  ?PFT Results  ?FVC-Pre L 4.57   4.23    ?FVC-Predicted Pre % 107   99    ?FVC-Post L 4.51   4.36    ?FVC-Predicted Post % 105   102    ?Pre FEV1/FVC % % 74  61    ?Post FEV1/FCV % % 74   71    ?FEV1-Pre L 3.40   2.60    ?FEV1-Predicted Pre % 99   75    ?FEV1-Post L 3.32   3.08    ?DLCO uncorrected ml/min/mmHg  25.79    ?DLCO UNC% %  79    ?DLVA Predicted %  92    ?TLC L  6.31    ?TLC % Predicted %  90    ?RV % Predicted %  92    ? ? ?Lab Results  ?Component Value Date  ? NITRICOXIDE 224 09/14/2017  ? ? ? ? ? ? ?Assessment & Plan:  ? ?COPD, mild ?Acute COPD/asthmatic bronchitic exacerbation. ?COVID-19 testing is negative .  ?Hold on antibiotics at this time. ?Check chest x-ray ?We will treat with empiric steroids. ? ?Plan  ?Patient Instructions  ?Begin Zyrtec 10 mg daily for 1 week and then as needed ?Mucinex DM Twice daily  As needed  Cough /congestion  ?Prednisone taper over next week .  ?Continue on Wixela inhaler twice daily, rinse after use ?Using Ventolin 2 puffs every 4 hours as needed for cough and wheezing. This is your rescue inhaler. ?Follow up Dr. Isaiah Serge in 6 months and As needed   ? ? ' ? ? ? ? ?Rubye Oaks, NP ?03/21/2022 ? ?

## 2022-03-21 NOTE — Assessment & Plan Note (Addendum)
Acute COPD/asthmatic bronchitic exacerbation. ?COVID-19 testing is negative .  ?Hold on antibiotics at this time. ?Check chest x-ray ?We will treat with empiric steroids. ? ?Plan  ?Patient Instructions  ?Begin Zyrtec 10 mg daily for 1 week and then as needed ?Mucinex DM Twice daily  As needed  Cough /congestion  ?Prednisone taper over next week .  ?Continue on Wixela inhaler twice daily, rinse after use ?Using Ventolin 2 puffs every 4 hours as needed for cough and wheezing. This is your rescue inhaler. ?Follow up Dr. Isaiah Serge in 6 months and As needed   ? ? ' ? ?

## 2022-03-21 NOTE — Patient Instructions (Signed)
Begin Zyrtec 10 mg daily for 1 week and then as needed ?Mucinex DM Twice daily  As needed  Cough /congestion  ?Prednisone taper over next week .  ?Continue on Wixela inhaler twice daily, rinse after use ?Using Ventolin 2 puffs every 4 hours as needed for cough and wheezing. This is your rescue inhaler. ?Follow up Dr. Isaiah Serge in 6 months and As needed   ? ?

## 2022-07-17 ENCOUNTER — Telehealth: Payer: Self-pay | Admitting: Adult Health

## 2022-07-17 DIAGNOSIS — Z0289 Encounter for other administrative examinations: Secondary | ICD-10-CM

## 2022-07-17 NOTE — Telephone Encounter (Signed)
Received a faxed request from [employer] for FMLA for period 07/21/2022 - 07/21/2023.   Dr. Isaiah Serge previously signed FMLA form on 11/17/2020.  Patient was seen by Rubye Oaks, NP on 03/21/2022.  I spoke to patient and he agreed with appointments up to 3 times/year lasting 4 hours, absences up to 3-4 times/month lasting 1-2 days.  Sent form to [employer] for signature.   Due to insurance by 07/20/2022.   Put in a note to front desk to call patient to schedule 6 month follow-up.

## 2022-07-19 NOTE — Telephone Encounter (Signed)
Faxed signed FMLA form to [employer] - fax# [phone].  Mailed hard copy to patient.

## 2022-08-03 ENCOUNTER — Ambulatory Visit: Payer: Commercial Managed Care - PPO | Admitting: Adult Health

## 2022-08-08 ENCOUNTER — Other Ambulatory Visit (INDEPENDENT_AMBULATORY_CARE_PROVIDER_SITE_OTHER): Payer: Commercial Managed Care - PPO | Admitting: *Deleted

## 2022-08-08 ENCOUNTER — Encounter: Payer: Self-pay | Admitting: Adult Health

## 2022-08-08 ENCOUNTER — Encounter: Payer: Self-pay | Admitting: *Deleted

## 2022-08-08 ENCOUNTER — Ambulatory Visit: Payer: Commercial Managed Care - PPO | Admitting: Adult Health

## 2022-08-08 DIAGNOSIS — R059 Cough, unspecified: Secondary | ICD-10-CM | POA: Diagnosis not present

## 2022-08-08 DIAGNOSIS — J449 Chronic obstructive pulmonary disease, unspecified: Secondary | ICD-10-CM | POA: Diagnosis not present

## 2022-08-08 DIAGNOSIS — U071 COVID-19: Secondary | ICD-10-CM

## 2022-08-08 LAB — POC COVID19 BINAXNOW: SARS Coronavirus 2 Ag: POSITIVE — AB

## 2022-08-08 MED ORDER — AZITHROMYCIN 250 MG PO TABS: ORAL_TABLET | ORAL | 0 refills | Status: AC

## 2022-08-08 MED ORDER — MOLNUPIRAVIR EUA 200MG CAPSULE: 4.0000 | ORAL_CAPSULE | Freq: Two times a day (BID) | ORAL | 0 refills | Status: AC

## 2022-08-08 MED ORDER — PREDNISONE 10 MG PO TABS: ORAL_TABLET | ORAL | 0 refills | Status: DC
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
## 2022-08-08 NOTE — Patient Instructions (Addendum)
COVID-19 infection-please quarantine at home until symptom-free/fever free for 3 days. Begin Molnupiravir for 5 days Z-Pak take as directed Prednisone taper over the next week Zyrtec 10 mg daily for 1 week and then as needed Mucinex DM Twice daily  As needed  Cough /congestion  Prednisone taper over next week .  Continue on Advair  inhaler twice daily, rinse after use Using Ventolin 2 puffs every 4 hours as needed for cough and wheezing. This is your rescue inhaler. Follow up Dr. Isaiah Serge or Aavya Shafer NP in 4 weeks and As needed   Please contact office for sooner follow up if symptoms do not improve or worsen or seek emergency care

## 2022-08-10 DIAGNOSIS — U071 COVID-19: Secondary | ICD-10-CM | POA: Insufficient documentation

## 2022-08-10 NOTE — Assessment & Plan Note (Signed)
COVID-19 infection-patient with 3 to 4 days of symptoms.  Patient has underlying COPD with asthma with current flare.  We will treat with antiviral therapy.  Patient education was given.  Discussed quarantine at home Advised on red flag symptoms such as chest pain, hemoptysis shortness of breath, nausea vomiting diarrhea to report immediately or go seek emergency room care.  Plan  Patient Instructions  COVID-19 infection-please quarantine at home until symptom-free/fever free for 3 days. Begin Molnupiravir for 5 days Z-Pak take as directed Prednisone taper over the next week Zyrtec 10 mg daily for 1 week and then as needed Mucinex DM Twice daily  As needed  Cough /congestion  Prednisone taper over next week .  Continue on Advair  inhaler twice daily, rinse after use Using Ventolin 2 puffs every 4 hours as needed for cough and wheezing. This is your rescue inhaler. Follow up Dr. Isaiah Serge or Dellia Donnelly NP in 4 weeks and As needed   Please contact office for sooner follow up if symptoms do not improve or worsen or seek emergency care

## 2022-08-10 NOTE — Assessment & Plan Note (Signed)
Acute COPD exacerbation with COVID-19 infection We will treat with empiric antibiotics and steroids.  Also add an antiviral therapy  Plan  Patient Instructions  COVID-19 infection-please quarantine at home until symptom-free/fever free for 3 days. Begin Molnupiravir for 5 days Z-Pak take as directed Prednisone taper over the next week Zyrtec 10 mg daily for 1 week and then as needed Mucinex DM Twice daily  As needed  Cough /congestion  Prednisone taper over next week .  Continue on Advair  inhaler twice daily, rinse after use Using Ventolin 2 puffs every 4 hours as needed for cough and wheezing. This is your rescue inhaler. Follow up Dr. Isaiah Serge or Elsbeth Yearick NP in 4 weeks and As needed   Please contact office for sooner follow up if symptoms do not improve or worsen or seek emergency care

## 2022-09-05 ENCOUNTER — Ambulatory Visit: Payer: Commercial Managed Care - PPO | Admitting: Adult Health

## 2022-09-05 ENCOUNTER — Encounter: Payer: Self-pay | Admitting: Adult Health

## 2022-09-05 DIAGNOSIS — J449 Chronic obstructive pulmonary disease, unspecified: Secondary | ICD-10-CM

## 2022-09-05 DIAGNOSIS — U071 COVID-19: Secondary | ICD-10-CM | POA: Diagnosis not present

## 2022-09-05 NOTE — Patient Instructions (Addendum)
Continue on Advair (Wixela)  inhaler twice daily, rinse after use  Using Ventolin 2 puffs every 4 hours as needed for cough and wheezing. This is your rescue inhaler. Flu shot early next month.  Follow up Dr. Isaiah Serge or Neale Marzette NP in 6 months with PFTs .  Please contact office for sooner follow up if symptoms do not improve or worsen or seek emergency care

## 2022-09-05 NOTE — Progress Notes (Signed)
@Patient  ID: , male    DOB: [birth year], 51 y.o.   MRN: [id number]  Chief Complaint  Patient presents with   Follow-up    Referring provider: [id number], MD  HPI: 51 year old male former smoker followed for mild COPD with asthma overlap  TEST/EVENTS :  Pets: No pets, no birds Occupation: He worked with [employer]. 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  No down pillows or comforters Smoking history: 6-pack-year smoking history.  Quit in January 2021 Travel history: Originally from February 2021. He has traveled to [place], [place], [place], [place], [place], [place], [place], [place], [place], [place], [place], & [place]. He served in [place] and has been to [employer] & middle [place], [place], [place], [place], [place], & [place].  Relevant family history: No significant family history of lung disease  09/05/2022 Follow up: COPD  Patient presents for 1 month follow-up.  Patient was seen last visit for an acute office visit.  Patient had a COPD exacerbation and was positive for COVID-19 infection.  Patient was treated with a Z-Pak and prednisone taper along with molnupiravir x5 days.  Since last visit patient is feeling much better. Decreased cough and congestion. Back to work, drives the city bus.  Remains on Advair Twice daily  . No increased albuterol use.  Chest xray March of this year showed clear lungs.  Appetite is starting to improve with no n/v/d.       Allergies  Allergen Reactions   Hydrocodone Itching   Norco [Hydrocodone-Acetaminophen] Itching    Nightmares.     Immunization History  Administered Date(s) Administered   Influenza,inj,Quad PF,6+ Mos 09/14/2019, 09/14/2019   Influenza,inj,Quad PF,6-35 Mos 08/26/2019   Influenza-Unspecified 08/26/2019   Moderna Sars-Covid-2 Vaccination 03/02/2020, 03/30/2020,  12/21/2020   Tdap 08/02/2010, 08/02/2010, 10/26/2011    Past Medical History:  Diagnosis Date   Arthritis    Asthma    COPD (chronic obstructive pulmonary disease) (HCC)    Depression    H/O spinal cord injury    PTSD (post-traumatic stress disorder)     Tobacco History: Social History   Tobacco Use  Smoking Status Former   Packs/day: 0.25   Years: 25.00   Total pack years: 6.25   Types: Cigarettes   Start date: 12/25/1988   Quit date: 01/15/2020   Years since quitting: 2.6  Smokeless Tobacco Never  Tobacco Comments   uses vape & peak rate of 1/2ppd   Counseling given: Not Answered Tobacco comments: uses vape & peak rate of 1/2ppd   Outpatient Medications Prior to Visit  Medication Sig Dispense Refill   albuterol (ACCUNEB) 1.25 MG/3ML nebulizer solution INHALE 1 VIAL VIA NEBULIZER EVERY 6 (SIX) HOURS AS NEEDED FOR WHEEZING. 150 mL 3   albuterol (VENTOLIN HFA) 108 (90 Base) MCG/ACT inhaler Inhale 2 puffs into the lungs every 4 (four) hours as needed for wheezing or shortness of breath. 8 g 5   Carboxymethylcellulose Sodium 0.25 % SOLN INSTILL 1 DROP IN EACH EYE 2-4 TIMES DAILY CONTACT PHARMACY WHEN NEW SUPPLY IS NEEDED     DULoxetine (CYMBALTA) 30 MG capsule Take by mouth.     famotidine (PEPCID) 20 MG tablet TAKE ONE TABLET BY MOUTH AT BEDTIME FOR GERD     fluticasone-salmeterol (ADVAIR) 250-50 MCG/ACT AEPB INHALE 1 INHALATION BY MOUTH TWICE A DAY (RINSE MOUTH  WELL WITH WATER AFTER EACH USE)     ketotifen (ZADITOR) 0.025 % ophthalmic solution INSTILL 1 DROP IN EACH EYE 1-2 TIMES A DAY     meloxicam (MOBIC) 15 MG tablet Take 1 tablet by mouth daily as needed.     sildenafil (VIAGRA) 100 MG tablet TAKE ONE TABLET BY MOUTH AS INSTRUCTED (TAKE 1 HOUR PRIOR TO SEXUAL ACTIVITY *DO NOT EXCEED 1 DOSE PER 24 HOUR PERIOD*)     traZODone (DESYREL) 50 MG tablet Take 50 mg by mouth at bedtime.     guaiFENesin (MUCINEX) 600 MG 12 hr tablet Take 1,200 mg by mouth 2 (two) times daily.  (Patient not taking: Reported on 09/05/2022)     predniSONE (DELTASONE) 10 MG tablet 4 tabs for 2 days, then 3 tabs for 2 days, 2 tabs for 2 days, then 1 tab for 2 days, then stop 20 tablet 0   Spacer/Aero Chamber Mouthpiece MISC 1 Device as directed by Does not apply route. 1 each 0   No facility-administered medications prior to visit.     Review of Systems:   Constitutional:   No  weight loss, night sweats,  Fevers, chills,  +fatigue, or  lassitude.  HEENT:   No headaches,  Difficulty swallowing,  Tooth/dental problems, or  Sore throat,                No sneezing, itching, ear ache, nasal congestion, post nasal drip,   CV:  No chest pain,  Orthopnea, PND, swelling in lower extremities, anasarca, dizziness, palpitations, syncope.   GI  No heartburn, indigestion, abdominal pain, nausea, vomiting, diarrhea, change in bowel habits, loss of appetite, bloody stools.   Resp: No shortness of breath with exertion or at rest.  No excess mucus, no productive cough,  No non-productive cough,  No coughing up of blood.  No change in color of mucus.  No wheezing.  No chest wall deformity  Skin: no rash or lesions.  GU: no dysuria, change in color of urine, no urgency or frequency.  No flank pain, no hematuria   MS:  No joint pain or swelling.  No decreased range of motion.  No back pain.    Physical Exam  BP 130/74 (BP Location: Left Arm, Patient Position: Sitting, Cuff Size: Normal)   Pulse 90   Temp 98.1 F (36.7 C) (Oral)   Ht 5\' 9"  (1.753 m)   Wt 202 lb 12.8 oz (92 kg)   SpO2 95%   BMI 29.95 kg/m   GEN: A/Ox3; pleasant , NAD, well nourished    HEENT:  [place]/AT,  EACs-clear, TMs-wnl, NOSE-clear, THROAT-clear, no lesions, no postnasal drip or exudate noted.   NECK:  Supple w/ fair ROM; no JVD; normal carotid impulses w/o bruits; no thyromegaly or nodules palpated; no lymphadenopathy.    RESP  Clear  P & A; w/o, wheezes/ rales/ or rhonchi. no accessory muscle use, no dullness to  percussion  CARD:  RRR, no m/r/g, no peripheral edema, pulses intact, no cyanosis or clubbing.  GI:   Soft & nt; nml bowel sounds; no organomegaly or masses detected.   Musco: Warm bil, no deformities or joint swelling noted.   Neuro: alert, no focal deficits noted.    Skin: Warm, no lesions or rashes    Lab Results:   BMET   BNP No results found for: "BNP"  ProBNP No results found for: "PROBNP"  Imaging: No results found.       Latest Ref Rng & Units  02/24/2016    2:19 PM 09/03/2015    2:05 PM  PFT Results  FVC-Pre L 4.57  4.23   FVC-Predicted Pre % 107  99   FVC-Post L 4.51  4.36   FVC-Predicted Post % 105  102   Pre FEV1/FVC % % 74  61   Post FEV1/FCV % % 74  71   FEV1-Pre L 3.40  2.60   FEV1-Predicted Pre % 99  75   FEV1-Post L 3.32  3.08   DLCO uncorrected ml/min/mmHg  25.79   DLCO UNC% %  79   DLVA Predicted %  92   TLC L  6.31   TLC % Predicted %  90   RV % Predicted %  92     Lab Results  Component Value Date   NITRICOXIDE 224 09/14/2017        Assessment & Plan:   COPD, mild Recent COPD exacerbation with COVID-19 infection.  Now resolved.  Patient appears to be back to baseline.  Needs PFTs on return visit in 6 months.  Continue on current ICS/LABA combo  Plan  Patient Instructions  Continue on Advair (Wixela)  inhaler twice daily, rinse after use  Using Ventolin 2 puffs every 4 hours as needed for cough and wheezing. This is your rescue inhaler. Flu shot early next month.  Follow up Dr. Isaiah Serge or Timi Reeser NP in 6 months with PFTs .  Please contact office for sooner follow up if symptoms do not improve or worsen or seek emergency care       COVID-19 virus infection Appears to be resolved totally.  Patient has no apparent sequela.  Continue with current regimen     Rubye Oaks, NP 09/05/2022

## 2022-09-05 NOTE — Assessment & Plan Note (Signed)
Recent COPD exacerbation with COVID-19 infection.  Now resolved.  Patient appears to be back to baseline.  Needs PFTs on return visit in 6 months.  Continue on current ICS/LABA combo  Plan  Patient Instructions  Continue on Advair (Wixela)  inhaler twice daily, rinse after use  Using Ventolin 2 puffs every 4 hours as needed for cough and wheezing. This is your rescue inhaler. Flu shot early next month.  Follow up Dr. Isaiah Serge or Corona Popovich NP in 6 months with PFTs .  Please contact office for sooner follow up if symptoms do not improve or worsen or seek emergency care

## 2022-09-05 NOTE — Assessment & Plan Note (Signed)
Appears to be resolved totally.  Patient has no apparent sequela.  Continue with current regimen

## 2022-09-05 NOTE — Addendum Note (Signed)
Addended by: Dorisann Frames R on: 09/05/2022 11:59 AM   Modules accepted: Orders

## 2022-12-28 ENCOUNTER — Encounter (HOSPITAL_COMMUNITY): Payer: Self-pay

## 2022-12-28 ENCOUNTER — Emergency Department (HOSPITAL_COMMUNITY)
Admission: EM | Admit: 2022-12-28 | Discharge: 2022-12-28 | Discharge status: Against Medical Advice | Payer: No Typology Code available for payment source | Attending: Emergency Medicine | Admitting: Emergency Medicine

## 2022-12-28 ENCOUNTER — Emergency Department (HOSPITAL_COMMUNITY): Payer: No Typology Code available for payment source

## 2022-12-28 ENCOUNTER — Other Ambulatory Visit: Payer: Self-pay

## 2022-12-28 DIAGNOSIS — Z5321 Procedure and treatment not carried out due to patient leaving prior to being seen by health care provider: Secondary | ICD-10-CM | POA: Insufficient documentation

## 2022-12-28 DIAGNOSIS — M542 Cervicalgia: Secondary | ICD-10-CM | POA: Insufficient documentation

## 2022-12-28 DIAGNOSIS — R079 Chest pain, unspecified: Secondary | ICD-10-CM | POA: Insufficient documentation

## 2022-12-28 LAB — CBC WITH DIFFERENTIAL/PLATELET
Abs Immature Granulocytes: 0.01 10*3/uL (ref 0.00–0.07)
Basophils Absolute: 0 10*3/uL (ref 0.0–0.1)
Basophils Relative: 1 %
Eosinophils Absolute: 0.1 10*3/uL (ref 0.0–0.5)
Eosinophils Relative: 2 %
HCT: 45 % (ref 39.0–52.0)
Hemoglobin: 15.2 g/dL (ref 13.0–17.0)
Immature Granulocytes: 0 %
Lymphocytes Relative: 33 %
Lymphs Abs: 1.3 10*3/uL (ref 0.7–4.0)
MCH: 31.6 pg (ref 26.0–34.0)
MCHC: 33.8 g/dL (ref 30.0–36.0)
MCV: 93.6 fL (ref 80.0–100.0)
Monocytes Absolute: 0.4 10*3/uL (ref 0.1–1.0)
Monocytes Relative: 11 %
Neutro Abs: 2 10*3/uL (ref 1.7–7.7)
Neutrophils Relative %: 53 %
Platelets: 265 10*3/uL (ref 150–400)
RBC: 4.81 MIL/uL (ref 4.22–5.81)
RDW: 13.2 % (ref 11.5–15.5)
WBC: 3.9 10*3/uL — ABNORMAL LOW (ref 4.0–10.5)
nRBC: 0 % (ref 0.0–0.2)

## 2022-12-28 LAB — COMPREHENSIVE METABOLIC PANEL
ALT: 25 U/L (ref 0–44)
AST: 19 U/L (ref 15–41)
Albumin: 3.8 g/dL (ref 3.5–5.0)
Alkaline Phosphatase: 44 U/L (ref 38–126)
Anion gap: 8 (ref 5–15)
BUN: 10 mg/dL (ref 6–20)
CO2: 27 mmol/L (ref 22–32)
Calcium: 9.6 mg/dL (ref 8.9–10.3)
Chloride: 105 mmol/L (ref 98–111)
Creatinine, Ser: 1.09 mg/dL (ref 0.61–1.24)
GFR, Estimated: >60 mL/min (ref >60)
Glucose, Bld: 92 mg/dL (ref 70–99)
Potassium: 4.4 mmol/L (ref 3.5–5.1)
Sodium: 140 mmol/L (ref 135–145)
Total Bilirubin: 0.7 mg/dL (ref 0.3–1.2)
Total Protein: 7.9 g/dL (ref 6.5–8.1)

## 2022-12-28 LAB — TROPONIN I (HIGH SENSITIVITY)
Troponin I (High Sensitivity): 2 ng/L (ref <18)
Troponin I (High Sensitivity): <2 ng/L (ref <18)

## 2022-12-28 MED ORDER — METHOCARBAMOL 500 MG PO TABS
1000.0000 mg | ORAL_TABLET | Freq: Once | ORAL | Status: DC
  Filled 2022-12-28: qty 2

## 2022-12-28 NOTE — ED Triage Notes (Signed)
Patient said he began having left sided chest pain that started when he woke up yesterday. Radiates to left shoulder and upper back. No nausea or vomiting or sweatiness.

## 2022-12-28 NOTE — ED Notes (Signed)
Pt left AMA because of long wait time.

## 2022-12-28 NOTE — ED Provider Triage Note (Signed)
Emergency Medicine Provider Triage Evaluation Note  Glenn Dean , a 52 y.o. male  was evaluated in triage.  Pt complains of neck and chest/arm pain with turning to the left. No SOB. No pain at rest. No injury.   Review of Systems  Positive: CP and neck pain radiating to the left arm and leg Negative: Numbness/weakness and SOB  Physical Exam  BP (!) 142/85 (BP Location: Left Arm)   Pulse 77   Temp 98.4 F (36.9 C) (Oral)   Resp 19   Ht 5\' 9"  (1.753 m)   Wt 83.9 kg   SpO2 100%   BMI 27.32 kg/m  Gen:   Awake, no distress   Resp:  Normal effort  MSK:   Pain with movement of extremities on the left. Other:  No pronator drift. 5/5 strength in the upper/lower extremities.   Medical Decision Making  Medically screening exam initiated at 10:19 AM.  Appropriate orders placed.  Glenn Dean was informed that the remainder of the evaluation will be completed by another provider, this initial triage assessment does not replace that evaluation, and the importance of remaining in the ED until their evaluation is complete.    Margette Fast, MD 12/28/22 1023

## 2023-01-01 ENCOUNTER — Ambulatory Visit (INDEPENDENT_AMBULATORY_CARE_PROVIDER_SITE_OTHER): Payer: Commercial Managed Care - PPO | Admitting: Family Medicine

## 2023-01-01 ENCOUNTER — Encounter: Payer: Self-pay | Admitting: Family Medicine

## 2023-01-01 VITALS — BP 132/80 | HR 68 | Temp 98.3°F | Resp 16 | Ht 69.0 in | Wt 190.0 lb

## 2023-01-01 DIAGNOSIS — M25512 Pain in left shoulder: Secondary | ICD-10-CM | POA: Diagnosis not present

## 2023-01-01 MED ORDER — PREDNISONE 20 MG PO TABS: 40.0000 mg | ORAL_TABLET | Freq: Every day | ORAL | 0 refills | Status: AC

## 2023-01-01 MED ORDER — METHOCARBAMOL 750 MG PO TABS: 750.0000 mg | ORAL_TABLET | Freq: Three times a day (TID) | ORAL | 0 refills | Status: DC | PRN

## 2023-01-01 NOTE — Progress Notes (Signed)
Subjective:     Patient ID: Glenn Dean, male    DOB: 04/13/1971, 52 y.o.   MRN: [id number]  Chief Complaint  Patient presents with   COPD   Chest Pain    Chest pain radiating to left shoulder and back and down left leg [facility] Friday, but wait was over 10 hours so he left     HPI-here w/wife.  Last wk, started w/CP that rad to L shoulder and back and down L leg.  Seen at [facility] and told to go to ER so went to [facility]-did labs, etc, but 10 hr wait so left.    Woke up 12/28/22 w/pain L shoulder back, sharp.  Constant underlying pain, but intermitt sharp-"crippling".  Went to UC at [place].  Did EKG-sent to [facility].   Pain if turns to L.   Can only lift L arm to about 90 degrees.  Rotator cuff surgery in past on l.  Taking 800mg  ibu every 4 hrs since 1/4.   Some better today.   Health Maintenance Due  Topic Date Due   HIV Screening  Never done   Hepatitis C Screening  Never done   COLONOSCOPY (Pts 45-62yrs Insurance coverage will need to be confirmed)  Never done   Zoster Vaccines- Shingrix (1 of 2) Never done   DTaP/Tdap/Td (4 - Td or Tdap) 10/25/2021   COVID-19 Vaccine (4 - 2023-24 season) 08/25/2022    Past Medical History:  Diagnosis Date   Arthritis    Asthma    COPD (chronic obstructive pulmonary disease) (HCC)    Depression    H/O spinal cord injury    PTSD (post-traumatic stress disorder)     Past Surgical History:  Procedure Laterality Date   TOTAL HIP ARTHROPLASTY Left 11/04/2014   Procedure: LEFT TOTAL HIP ARTHROPLASTY ANTERIOR APPROACH;  Surgeon: 13/10/2014, MD;  Location: [facility];  Service: Orthopedics;  Laterality: Left;    Outpatient Medications Prior to Visit  Medication Sig Dispense Refill   albuterol (ACCUNEB) 1.25 MG/3ML nebulizer solution INHALE 1 VIAL VIA NEBULIZER EVERY 6 (SIX) HOURS AS NEEDED FOR WHEEZING. 150 mL 3   albuterol (VENTOLIN HFA) 108 (90 Base) MCG/ACT inhaler Inhale 2 puffs into the lungs every 4 (four) hours as needed for  wheezing or shortness of breath. 8 g 5   Carboxymethylcellulose Sodium 0.25 % SOLN INSTILL 1 DROP IN EACH EYE 2-4 TIMES DAILY CONTACT PHARMACY WHEN NEW SUPPLY IS NEEDED     DULoxetine (CYMBALTA) 30 MG capsule Take by mouth.     famotidine (PEPCID) 20 MG tablet TAKE ONE TABLET BY MOUTH AT BEDTIME FOR GERD     fluticasone-salmeterol (ADVAIR) 250-50 MCG/ACT AEPB INHALE 1 INHALATION BY MOUTH TWICE A DAY (RINSE MOUTH WELL WITH WATER AFTER EACH USE)     guaiFENesin (MUCINEX) 600 MG 12 hr tablet Take 1,200 mg by mouth 2 (two) times daily.     ketotifen (ZADITOR) 0.025 % ophthalmic solution INSTILL 1 DROP IN EACH EYE 1-2 TIMES A DAY     sildenafil (VIAGRA) 100 MG tablet TAKE ONE TABLET BY MOUTH AS INSTRUCTED (TAKE 1 HOUR PRIOR TO SEXUAL ACTIVITY *DO NOT EXCEED 1 DOSE PER 24 HOUR PERIOD*)     Spacer/Aero Chamber Mouthpiece MISC 1 Device as directed by Does not apply route. 1 each 0   traZODone (DESYREL) 50 MG tablet Take 50 mg by mouth at bedtime.     meloxicam (MOBIC) 15 MG tablet Take 1 tablet by  mouth daily as needed.     predniSONE (DELTASONE) 10 MG tablet 4 tabs for 2 days, then 3 tabs for 2 days, 2 tabs for 2 days, then 1 tab for 2 days, then stop 20 tablet 0   No facility-administered medications prior to visit.    Allergies  Allergen Reactions   Hydrocodone Itching   Norco [Hydrocodone-Acetaminophen] Itching    Nightmares.    ROS neg/noncontributory except as noted HPI/below      Objective:     BP 132/80 (BP Location: Left Arm, Patient Position: Sitting, Cuff Size: Normal)   Pulse 68   Temp 98.3 F (36.8 C) (Temporal)   Resp 16   Ht 5\' 9"  (1.753 m)   Wt 190 lb (86.2 kg)   SpO2 99%   BMI 28.06 kg/m  Wt Readings from Last 3 Encounters:  01/01/23 190 lb (86.2 kg)  12/28/22 185 lb (83.9 kg)  09/05/22 202 lb 12.8 oz (92 kg)    Physical Exam   Gen: WDWN NAD HEENT: NCAT, conjunctiva not injected, sclera nonicteric NECK:  supple, no thyromegaly, no nodes, no carotid  bruits CARDIAC: RRR, S1S2+, no murmur. DP 2+B LUNGS: CTAB. No wheezes ABDOMEN:  BS+, soft, NTND, No HSM, no masses EXT:  no edema MSK: Muscle strength 5/5 bilateral upper extremities but pain on left no TTP cervical spine, anterior chest wall.  Some TTP bicipital groove.  A lot of tight muscles neck/upper shoulders.  Decreased range of motion left shoulder.  Abduction to about 90 degrees.  Rotation painful.  NEURO: A&O x3.  CN II-XII intact.  PSYCH: normal mood. Good eye contact  Reviewed [facility] and [facility].  Spent 45 minutes with patient and wife reviewing records, going over results of studies, devising plan.     Assessment & Plan:   Problem List Items Addressed This Visit   None Visit Diagnoses     Acute pain of left shoulder    -  Primary     1.  Left shoulder pain-was having chest pain as well.  No rash.  Workup for MI was ruled out.  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  Suspect the pain is from nerve impingement.  He is getting better overall, however still having pain.  Advised to stop ibuprofen.  Will do prednisone 40 mg daily, Robaxin 3 times daily 750 mg as needed.  Advised it may make him drowsy.  If pain continues, worsens-do referral to pain management for possible injections.  Advised that it is impossible to tell if this will be recurring, one-time thing, other.  He will keep in touch.  Meds ordered this encounter  Medications   predniSONE (DELTASONE) 20 MG tablet    Sig: Take 2 tablets (40 mg total) by mouth daily with breakfast for 5 days.    Dispense:  10 tablet    Refill:  0   methocarbamol (ROBAXIN-750) 750 MG tablet    Sig: Take 1 tablet (750 mg total) by mouth every 8 (eight) hours as needed for muscle spasms.    Dispense:  20 tablet    Refill:  0    Wellington Hampshire, MD

## 2023-01-01 NOTE — Patient Instructions (Addendum)
It was very nice to see you today!  No ibuprofen while on prednisone.  You can take tylenol.   Robaxin(muscle relaxor)  if needed-may make drowsy.     [facility] at [place] on the 1st floor Phone number [phone]    PLEASE NOTE:  If you had any lab tests please let us know if you have not heard back within a few days. You may see your results on MyChart before we have a chance to review them but we will give you a call once they are reviewed by [place]. If we ordered any referrals today, please let us know if you have not heard from their office within the next week.   Please try these tips to maintain a healthy lifestyle:  Eat most of your calories during the day when you are active. Eliminate processed foods including packaged sweets (pies, cakes, cookies), reduce intake of potatoes, white bread, white pasta, and white rice. Look for whole grain options, oat flour or almond flour.  Each meal should contain half fruits/vegetables, one quarter protein, and one quarter carbs (no bigger than a computer mouse).  Cut down on sweet beverages. This includes juice, soda, and sweet tea. Also watch fruit intake, though this is a healthier sweet option, it still contains natural sugar! Limit to 3 servings daily.  Drink at least 1 glass of water with each meal and aim for at least 8 glasses per day  Exercise at least 150 minutes every week.

## 2023-06-12 ENCOUNTER — Ambulatory Visit (INDEPENDENT_AMBULATORY_CARE_PROVIDER_SITE_OTHER): Payer: Commercial Managed Care - PPO | Admitting: Adult Health

## 2023-06-12 ENCOUNTER — Ambulatory Visit (INDEPENDENT_AMBULATORY_CARE_PROVIDER_SITE_OTHER): Payer: Commercial Managed Care - PPO | Admitting: Pulmonary Disease

## 2023-06-12 ENCOUNTER — Encounter: Payer: Self-pay | Admitting: Adult Health

## 2023-06-12 VITALS — BP 140/82 | HR 78 | Temp 98.1°F | Ht 69.0 in | Wt 205.6 lb

## 2023-06-12 DIAGNOSIS — J449 Chronic obstructive pulmonary disease, unspecified: Secondary | ICD-10-CM

## 2023-06-12 LAB — PULMONARY FUNCTION TEST
DL/VA % pred: 108 %
DL/VA: 4.72 ml/min/mmHg/L
DLCO cor % pred: 95 %
DLCO cor: 26.52 ml/min/mmHg
DLCO unc % pred: 95 %
DLCO unc: 26.52 ml/min/mmHg
FEF 25-75 Post: 2.3 L/sec
FEF 25-75 Pre: 1.91 L/sec
FEF2575-%Change-Post: 20 %
FEF2575-%Pred-Post: 72 %
FEF2575-%Pred-Pre: 60 %
FEV1-%Change-Post: 4 %
FEV1-%Pred-Post: 75 %
FEV1-%Pred-Pre: 71 %
FEV1-Post: 2.77 L
FEV1-Pre: 2.65 L
FEV1FVC-%Change-Post: 6 %
FEV1FVC-%Pred-Pre: 95 %
FEV6-%Change-Post: -1 %
FEV6-%Pred-Post: 77 %
FEV6-%Pred-Pre: 78 %
FEV6-Post: 3.55 L
FEV6-Pre: 3.61 L
FEV6FVC-%Change-Post: 0 %
FEV6FVC-%Pred-Post: 104 %
FEV6FVC-%Pred-Pre: 103 %
FVC-%Change-Post: -1 %
FVC-%Pred-Post: 74 %
FVC-%Pred-Pre: 75 %
FVC-Post: 3.55 L
FVC-Pre: 3.62 L
Post FEV1/FVC ratio: 78 %
Post FEV6/FVC ratio: 100 %
Pre FEV1/FVC ratio: 73 %
Pre FEV6/FVC Ratio: 100 %
RV % pred: 117 %
RV: 2.43 L
TLC % pred: 90 %
TLC: 6.15 L

## 2023-06-12 NOTE — Progress Notes (Signed)
Full Pft performed today.

## 2023-06-12 NOTE — Assessment & Plan Note (Addendum)
No significant airflow obstruction on PFT . Mild restriction.  More consistent with Asthma . Continue on Wixela Twice daily  . Albuterol inhaler. Control for triggers   Plan  Patient Instructions  Continue on Advair (Wixela)  inhaler twice daily, rinse after use  Using Ventolin 2 puffs every 4 hours as needed for cough and wheezing. This is your rescue inhaler. Follow up Dr. Isaiah Serge or Jagar Lua NP in 1 year and As needed

## 2023-06-12 NOTE — Patient Instructions (Signed)
Continue on Advair (Wixela)  inhaler twice daily, rinse after use  Using Ventolin 2 puffs every 4 hours as needed for cough and wheezing. This is your rescue inhaler. Follow up Dr. Isaiah Serge or John Williamsen NP in 1 year and As needed

## 2023-06-12 NOTE — Patient Instructions (Signed)
Full PFT performed today. °

## 2023-06-12 NOTE — Progress Notes (Signed)
@Patient  ID: Glenn Dean, male    DOB: [birth year], 52 y.o.   MRN: [id number]  Chief Complaint  Patient presents with   Follow-up    Referring provider: Clinic, [facility]  HPI: 52 year old male former smoker (6 PK yr hx )  followed for mild COPD with asthma overlap  TEST/EVENTS :  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  Currently works as a Midwife for the [employer] Exposures: No mold, hot tub, Jacuzzi.  No down pillows or comforters Smoking history: 6-pack-year smoking history.  Quit in January 2021 Travel history: Originally from [place]. He has traveled to [place], [place], [place], [place], [place], [place], [place], [place], [place], [place], [place], & [place]. He served in [employer] and has been to [place] & middle [employer], [place], [place], [place], [place], & [place].  Relevant family history: No significant family history of lung disease  06/12/2023 Follow up ; COPD  Patient presents for a follow-up visit.  Last seen September 2023.  Patient has underlying mild COPD with asthma overlap.  Overall his breathing is doing well.  He denies any cough or wheezing.  Patient had PFTs that were done today that showed mild to moderate restriction with FEV1 at 75%, ratio 78, FVC 74%, no significant bronchodilator response, diffusing capacity normal at 95%. Remains active at work. No formal exercise. Works Community education officer. Midwife.   Says recently seen at [facility] clinic with sinus issues. Started on sinus rinse and allergy medication. Some better. Has nasal congestion   Allergies  Allergen Reactions   Hydrocodone Itching   Norco [Hydrocodone-Acetaminophen] Itching    Nightmares.     Immunization History  Administered Date(s) Administered   Influenza,inj,Quad PF,6+ Mos 09/14/2019, 09/14/2019   Influenza,inj,Quad PF,6-35 Mos 08/26/2019   Influenza-Unspecified  08/26/2019   Moderna Sars-Covid-2 Vaccination 03/02/2020, 03/30/2020, 12/21/2020   Tdap 08/02/2010, 08/02/2010, 10/26/2011    Past Medical History:  Diagnosis Date   Arthritis    Asthma    COPD (chronic obstructive pulmonary disease) (HCC)    Depression    H/O spinal cord injury    PTSD (post-traumatic stress disorder)     Tobacco History: Social History   Tobacco Use  Smoking Status Former   Packs/day: 0.25   Years: 25.00   Additional pack years: 0.00   Total pack years: 6.25   Types: Cigarettes   Start date: 12/25/1988   Quit date: 01/15/2020   Years since quitting: 3.4  Smokeless Tobacco Never  Tobacco Comments   uses vape & peak rate of 1/2ppd   Counseling given: Not Answered Tobacco comments: uses vape & peak rate of 1/2ppd   Outpatient Medications Prior to Visit  Medication Sig Dispense Refill   albuterol (ACCUNEB) 1.25 MG/3ML nebulizer solution INHALE 1 VIAL VIA NEBULIZER EVERY 6 (SIX) HOURS AS NEEDED FOR WHEEZING. 150 mL 3   albuterol (VENTOLIN HFA) 108 (90 Base) MCG/ACT inhaler Inhale 2 puffs into the lungs every 4 (four) hours as needed for wheezing or shortness of breath. 8 g 5   Carboxymethylcellulose Sodium 0.25 % SOLN INSTILL 1 DROP IN EACH EYE 2-4 TIMES DAILY CONTACT PHARMACY WHEN NEW SUPPLY IS NEEDED     famotidine (PEPCID) 20 MG tablet TAKE ONE TABLET BY MOUTH AT BEDTIME FOR GERD     fluticasone-salmeterol (ADVAIR) 250-50 MCG/ACT AEPB INHALE 1 INHALATION BY MOUTH TWICE A DAY (RINSE MOUTH  WELL WITH WATER AFTER EACH USE)     guaiFENesin (MUCINEX) 600 MG 12 hr tablet Take 1,200 mg by mouth 2 (two) times daily.     ketotifen (ZADITOR) 0.025 % ophthalmic solution INSTILL 1 DROP IN EACH EYE 1-2 TIMES A DAY     sildenafil (VIAGRA) 100 MG tablet TAKE ONE TABLET BY MOUTH AS INSTRUCTED (TAKE 1 HOUR PRIOR TO SEXUAL ACTIVITY *DO NOT EXCEED 1 DOSE PER 24 HOUR PERIOD*)     Spacer/Aero Chamber Mouthpiece MISC 1 Device as directed by Does not apply route. 1 each 0    traZODone (DESYREL) 50 MG tablet Take 50 mg by mouth at bedtime.     DULoxetine (CYMBALTA) 30 MG capsule Take by mouth. (Patient not taking: Reported on 06/12/2023)     methocarbamol (ROBAXIN-750) 750 MG tablet Take 1 tablet (750 mg total) by mouth every 8 (eight) hours as needed for muscle spasms. 20 tablet 0   No facility-administered medications prior to visit.     Review of Systems:   Constitutional:   No  weight loss, night sweats,  Fevers, chills, fatigue, or  lassitude.  HEENT:   No headaches,  Difficulty swallowing,  Tooth/dental problems, or  Sore throat,                No sneezing, itching, ear ache,  +nasal congestion, post nasal drip,   CV:  No chest pain,  Orthopnea, PND, swelling in lower extremities, anasarca, dizziness, palpitations, syncope.   GI  No heartburn, indigestion, abdominal pain, nausea, vomiting, diarrhea, change in bowel habits, loss of appetite, bloody stools.   Resp: No shortness of breath with exertion or at rest.  No excess mucus, no productive cough,  No non-productive cough,  No coughing up of blood.  No change in color of mucus.  No wheezing.  No chest wall deformity  Skin: no rash or lesions.  GU: no dysuria, change in color of urine, no urgency or frequency.  No flank pain, no hematuria   MS:  No joint pain or swelling.  No decreased range of motion.  No back pain.    Physical Exam  BP (!) 140/82 (BP Location: Left Arm, Patient Position: Sitting, Cuff Size: Normal)   Pulse 78   Temp 98.1 F (36.7 C) (Oral)   Ht 5\' 9"  (1.753 m)   Wt 205 lb 9.6 oz (93.3 kg)   SpO2 100%   BMI 30.36 kg/m   GEN: A/Ox3; pleasant , NAD, well nourished    HEENT:  [place]/AT,  EACs-clear, TMs-wnl, NOSE-clear, THROAT-clear, no lesions, no postnasal drip or exudate noted.   NECK:  Supple w/ fair ROM; no JVD; normal carotid impulses w/o bruits; no thyromegaly or nodules palpated; no lymphadenopathy.    RESP  Clear  P & A; w/o, wheezes/ rales/ or rhonchi. no  accessory muscle use, no dullness to percussion  CARD:  RRR, no m/r/g, no peripheral edema, pulses intact, no cyanosis or clubbing.  GI:   Soft & nt; nml bowel sounds; no organomegaly or masses detected.   Musco: Warm bil, no deformities or joint swelling noted.   Neuro: alert, no focal deficits noted.    Skin: Warm, no lesions or rashes    Lab Results:    BNP No results found for: "BNP"  ProBNP No results found for: "PROBNP"  Imaging: No results found.       Latest Ref Rng & Units 06/12/2023    2:49 PM 02/24/2016    2:19 PM 09/03/2015  2:05 PM  PFT Results  FVC-Pre L 3.62  P 4.57  4.23   FVC-Predicted Pre % 75  P 107  99   FVC-Post L 3.55  P 4.51  4.36   FVC-Predicted Post % 74  P 105  102   Pre FEV1/FVC % % 73  P 74  61   Post FEV1/FCV % % 78  P 74  71   FEV1-Pre L 2.65  P 3.40  2.60   FEV1-Predicted Pre % 71  P 99  75   FEV1-Post L 2.77  P 3.32  3.08   DLCO uncorrected ml/min/mmHg 26.52  P  25.79   DLCO UNC% % 95  P  79   DLCO corrected ml/min/mmHg 26.52  P    DLCO COR %Predicted % 95  P    DLVA Predicted % 108  P  92   TLC L 6.15  P  6.31   TLC % Predicted % 90  P  90   RV % Predicted % 117  P  92     P Preliminary result    Lab Results  Component Value Date   NITRICOXIDE 224 09/14/2017        Assessment & Plan:   COPD, mild No significant airflow obstruction on PFT . Mild restriction.  More consistent with Asthma . Continue on Wixela Twice daily  . Albuterol inhaler. Control for triggers   Plan  Patient Instructions  Continue on Advair (Wixela)  inhaler twice daily, rinse after use  Using Ventolin 2 puffs every 4 hours as needed for cough and wheezing. This is your rescue inhaler. Follow up Dr. Isaiah Serge or [name] NP in 1 year and As needed        Rubye Oaks, NP 06/12/2023

## 2023-08-08 ENCOUNTER — Telehealth: Payer: Self-pay | Admitting: Adult Health

## 2023-08-08 NOTE — Telephone Encounter (Signed)
Pt came in to drop off FMLA Paperwork, dropped it off in [employer].

## 2023-08-28 ENCOUNTER — Other Ambulatory Visit: Payer: Self-pay

## 2023-09-03 MED ORDER — ALBUTEROL SULFATE HFA 108 (90 BASE) MCG/ACT IN AERS: 2.0000 | INHALATION_SPRAY | RESPIRATORY_TRACT | 5 refills | Status: AC | PRN

## 2023-09-03 NOTE — Telephone Encounter (Signed)
Refill sent, fmla pending

## 2023-10-02 ENCOUNTER — Telehealth: Payer: Self-pay | Admitting: Adult Health

## 2023-10-02 DIAGNOSIS — Z0289 Encounter for other administrative examinations: Secondary | ICD-10-CM

## 2023-10-02 NOTE — Telephone Encounter (Signed)
Patient's FMLA renewal form completed and sent to Rubye Oaks, NP for signature.  FMLA dates 07/21/2023 - 07/20/2024.   $29 processing fee applied to account.

## 2023-10-03 NOTE — Telephone Encounter (Signed)
FMLA form signed by Rubye Oaks, NP.  Faxed to [employer] [phone].   Mailed hard copy to patient.

## 2023-10-18 ENCOUNTER — Encounter: Payer: Self-pay | Admitting: Family Medicine

## 2023-10-25 ENCOUNTER — Encounter: Payer: Self-pay | Admitting: Family Medicine

## 2023-10-25 ENCOUNTER — Ambulatory Visit: Payer: Commercial Managed Care - PPO | Admitting: Family Medicine

## 2023-10-25 VITALS — BP 132/78 | HR 75 | Temp 97.8°F | Resp 18 | Ht 69.0 in | Wt 202.1 lb

## 2023-10-25 DIAGNOSIS — M1612 Unilateral primary osteoarthritis, left hip: Secondary | ICD-10-CM | POA: Diagnosis not present

## 2023-10-25 DIAGNOSIS — M5442 Lumbago with sciatica, left side: Secondary | ICD-10-CM | POA: Diagnosis not present

## 2023-10-25 DIAGNOSIS — G8929 Other chronic pain: Secondary | ICD-10-CM

## 2023-10-25 DIAGNOSIS — M5441 Lumbago with sciatica, right side: Secondary | ICD-10-CM

## 2023-10-25 DIAGNOSIS — J449 Chronic obstructive pulmonary disease, unspecified: Secondary | ICD-10-CM | POA: Diagnosis not present

## 2023-10-25 DIAGNOSIS — F4310 Post-traumatic stress disorder, unspecified: Secondary | ICD-10-CM | POA: Diagnosis not present

## 2023-10-25 DIAGNOSIS — Z0279 Encounter for issue of other medical certificate: Secondary | ICD-10-CM

## 2023-10-25 NOTE — Assessment & Plan Note (Signed)
Chronic.  Doing fair.  Has flare ups occ and misses work

## 2023-10-25 NOTE — Progress Notes (Signed)
Subjective:     Patient ID: Glenn Dean, male    DOB: 01/09/1971, 52 y.o.   MRN: [id number]  Chief Complaint  Patient presents with   Paperwork    Discuss need for FMLA paperwork for chronic back pain, asthma, mental health    HPI - newly married 2 weeks ago. Continues to f/u at [place]. Days he takes off of work tends to vary depending on his sx from all his conditions.   Chronic back pain - Complains of chronic back pain that has worsened considering the nature of his job. 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 He endorses throbbing back pain and BLE weakness L>R. Activity such as bending over and lifting heavy objects at work has worsened his pain. Also notes his pain worsens as the work day progresses. Reports that his back pain is a primary concern day-to-day compared to his other conditions. Being managed elsewhere, but needs FMLA paperwork filled out.  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  Or working 1/2 days(but not feasable).  Gets where "weak" and hard to move.  Misses 1-2 days every 2-3 wks.  Wife has to take off of work to help him at times.   Asthma / COPD - Complains of weekly asthma flare ups. Endorses shortness of breath with moderate exertion. Pt states if he had to walk across the distance of a [employer] parking lot he would get short of breath. Reports sob being easily triggered with associated coughing and getting "choked up", and is "easily winded". He has missed work for this, tends to vary. Taking Spiriva once daily, using albuterol rescue inhaler once daily, and using his nebulizer at nighttime. Followed up with pul[facility]ology at the [facility] and per pt was instructed to start spiriva. He has stopped Wixela, was unaware he was meant to take [place] and Spiriva together. Pt reports his wixela was more helpful. Has been having to miss work due to difficulty breathing. Has  not received pneumonia vaccine.  3.  PTSD-gets triggered at times and has to leave work    Health Maintenance Due  Topic Date Due   Hepatitis C Screening  Never done   DTaP/Tdap/Td (3 - Td or Tdap) 10/25/2021    Past Medical History:  Diagnosis Date   Arthritis    Asthma    COPD (chronic obstructive pulmonary disease) (HCC)    Depression    H/O spinal cord injury    PTSD (post-traumatic stress disorder)     Past Surgical History:  Procedure Laterality Date   TOTAL HIP ARTHROPLASTY Left 11/04/2014   Procedure: LEFT TOTAL HIP ARTHROPLASTY ANTERIOR APPROACH;  Surgeon: Loanne Drilling, MD;  [facility];  Service: Orthopedics;  Laterality: Left;     Current Outpatient Medications:    albuterol (ACCUNEB) 1.25 MG/3ML nebulizer solution, INHALE 1 VIAL VIA NEBULIZER EVERY 6 (SIX) HOURS AS NEEDED FOR WHEEZING., Disp: 150 mL, Rfl: 3   albuterol (VENTOLIN HFA) 108 (90 Base) MCG/ACT inhaler, Inhale 2 puffs into the lungs every 4 (four) hours as needed for wheezing or shortness of breath., Disp: 8 g, Rfl: 5   Carboxymethylcellulose Sodium 0.25 % SOLN, INSTILL 1 DROP IN EACH EYE 2-4 TIMES DAILY CONTACT PHARMACY WHEN NEW SUPPLY IS NEEDED, Disp: , Rfl:    cetirizine (ZYRTEC) 10 MG tablet, Take by mouth., Disp: , Rfl:  famotidine (PEPCID) 20 MG tablet, TAKE ONE TABLET BY MOUTH AT BEDTIME FOR GERD, Disp: , Rfl:    fluticasone (FLONASE) 50 MCG/ACT nasal spray, Place into the nose., Disp: , Rfl:    ketotifen (ZADITOR) 0.025 % ophthalmic solution, INSTILL 1 DROP IN EACH EYE 1-2 TIMES A DAY, Disp: , Rfl:    meloxicam (MOBIC) 15 MG tablet, Take 1 tablet by mouth daily as needed., Disp: , Rfl:    Spacer/Aero Chamber Mouthpiece MISC, 1 Device as directed by Does not apply route., Disp: 1 each, Rfl: 0   tadalafil (CIALIS) 20 MG tablet, Take 20 mg by mouth daily as needed., Disp: , Rfl:    Tiotropium Bromide Monohydrate 1.25 MCG/ACT AERS, Take by mouth., Disp: , Rfl:    traZODone (DESYREL) 50 MG  tablet, Take 50 mg by mouth at bedtime., Disp: , Rfl:    venlafaxine XR (EFFEXOR-XR) 75 MG 24 hr capsule, Take 75 mg by mouth every morning., Disp: , Rfl:   Allergies  Allergen Reactions   Hydrocodone Itching   Norco [Hydrocodone-Acetaminophen] Itching    Nightmares.    Hydrocodone-Acetaminophen Itching    Nightmares, , Nightmares.   ROS neg/noncontributory except as noted HPI/below      Objective:     BP 132/78 (BP Location: Left Arm, Patient Position: Sitting, Cuff Size: Large)   Pulse 75   Temp 97.8 F (36.6 C) (Temporal)   Resp 18   Ht 5\' 9"  (1.753 m)   Wt 202 lb 2 oz (91.7 kg)   SpO2 97%   BMI 29.85 kg/m  Wt Readings from Last 3 Encounters:  10/25/23 202 lb 2 oz (91.7 kg)  06/12/23 205 lb 9.6 oz (93.3 kg)  01/01/23 190 lb (86.2 kg)    Physical Exam   Gen: WDWN NAD HEENT: NCAT, conjunctiva not injected, sclera nonicteric NECK:  supple, no thyromegaly, no nodes, no carotid bruits CARDIAC: RRR, S1S2+, no murmur. DP 2+B LUNGS: CTAB. No wheezes ABDOMEN:  BS+, soft, NTND, No HSM, no masses EXT:  no edema MSK: no gross abnormalities.  NEURO: A&O x3.  CN II-XII intact.  PSYCH: normal mood. Good eye contact  Spent 45 minutes getting history, discussing taking meds,  filling out FMLA forms, [facility].      Assessment & Plan:  COPD, mild (HCC) Assessment & Plan: Chronic.  Worse than mild and asthma component as well.  Had stopped wixela when placed on spiriva, but not doing well.  Advised to take both as instructed,  and albuterol q 6 hrs prn.    PTSD (post-traumatic stress disorder) Assessment & Plan: Chronic.  Controlled mostly.  Managed by [facility].  Gets triggered at times and has to leave situation or get others to calm him(ie wife)   Primary osteoarthritis of left hip Assessment & Plan: Chronic.  Doing fair.  Has flare ups occ and misses work   Chronic bilateral low back pain with bilateral sciatica Assessment & Plan: Chronic.  Fair control.   Flares up frequently and misses work 1-2 days every 2-3 wks.  Wife has to come get him and help him at times so she misses work.      Return in about 1 year (around 10/24/2024) for annual physical.  I, Isabelle Course, acting as a scribe for Angelena Sole, MD., have documented all relevant documentation on the behalf of Angelena Sole, MD, as directed by  Angelena Sole, MD while in the presence of Angelena Sole, MD.  Ninfa Meeker  Rockney Ghee, MD, have reviewed all documentation for this visit. The documentation on 10/25/23 for the exam, diagnosis, procedures, and orders are all accurate and complete.  Angelena Sole, MD

## 2023-10-25 NOTE — Assessment & Plan Note (Signed)
Chronic.  Fair control.  Flares up frequently and misses work 1-2 days every 2-3 wks.  Wife has to come get him and help him at times so she misses work.

## 2023-10-25 NOTE — Assessment & Plan Note (Signed)
Chronic.  Worse than mild and asthma component as well.  Had stopped wixela when placed on spiriva, but not doing well.  Advised to take both as instructed,  and albuterol q 6 hrs prn.

## 2023-10-25 NOTE — Assessment & Plan Note (Signed)
Chronic.  Controlled mostly.  Managed by [facility].  Gets triggered at times and has to leave situation or get others to calm him(ie wife)

## 2023-10-25 NOTE — Patient Instructions (Signed)
Restart wixela  Get dates of hiv and hep tests from [place]  Get immunizations

## 2023-10-26 ENCOUNTER — Telehealth: Payer: Self-pay | Admitting: Family Medicine

## 2023-10-26 NOTE — Telephone Encounter (Signed)
Patient came and picked up FMLA paperwork for him and his wife.

## 2023-10-26 NOTE — Telephone Encounter (Signed)
Forms copied and faxed to [facility] and placed up front for pick up. Both patient and wife notified.

## 2023-10-26 NOTE — Telephone Encounter (Signed)
Form placed on providers desk 

## 2023-10-26 NOTE — Telephone Encounter (Signed)
Patient and his spouse came in on 10/31 to drop off FMLA paperwork that is needing to be filled out for his spouse. They state this is needed so patient's spouse can be allowed accommodations by employer while being his care giver. FMLA paperwork has been placed in provider's box with green sheet.

## 2023-10-26 NOTE — Telephone Encounter (Signed)
Patient and wife also informed that there is $29 fee for the wife's forms. Both verbalized understanding.
